# Patient Record
Sex: Female | Born: 1975 | Race: White | Hispanic: No | Smoking: Current every day smoker
Health system: Southern US, Community
[De-identification: ages and names within clinical notes are randomized; demographics above are authoritative.]

## PROBLEM LIST (undated history)

## (undated) DIAGNOSIS — I7300 Raynaud's syndrome without gangrene: Secondary | ICD-10-CM

## (undated) DIAGNOSIS — G43909 Migraine, unspecified, not intractable, without status migrainosus: Secondary | ICD-10-CM

## (undated) DIAGNOSIS — M064 Inflammatory polyarthropathy: Secondary | ICD-10-CM

## (undated) DIAGNOSIS — K219 Gastro-esophageal reflux disease without esophagitis: Secondary | ICD-10-CM

## (undated) DIAGNOSIS — E538 Deficiency of other specified B group vitamins: Secondary | ICD-10-CM

## (undated) DIAGNOSIS — M7918 Myalgia, other site: Secondary | ICD-10-CM

## (undated) DIAGNOSIS — E559 Vitamin D deficiency, unspecified: Secondary | ICD-10-CM

## (undated) DIAGNOSIS — J45909 Unspecified asthma, uncomplicated: Secondary | ICD-10-CM

## (undated) HISTORY — DX: Unspecified asthma, uncomplicated: J45.909

## (undated) HISTORY — DX: Migraine, unspecified, not intractable, without status migrainosus: G43.909

## (undated) HISTORY — PX: TYMPANOPLASTY: SHX33

## (undated) HISTORY — DX: Gastro-esophageal reflux disease without esophagitis: K21.9

---

## 2005-07-31 HISTORY — PX: TUBAL LIGATION: SHX77

## 2011-03-09 ENCOUNTER — Ambulatory Visit: Payer: Self-pay | Admitting: Internal Medicine

## 2014-02-25 DIAGNOSIS — G43909 Migraine, unspecified, not intractable, without status migrainosus: Secondary | ICD-10-CM | POA: Insufficient documentation

## 2014-03-10 ENCOUNTER — Ambulatory Visit: Payer: Self-pay | Admitting: Neurology

## 2015-07-13 ENCOUNTER — Encounter: Payer: Self-pay | Admitting: Family Medicine

## 2015-07-13 ENCOUNTER — Ambulatory Visit (INDEPENDENT_AMBULATORY_CARE_PROVIDER_SITE_OTHER): Payer: Medicaid Other | Admitting: Family Medicine

## 2015-07-13 VITALS — BP 100/62 | HR 64 | Ht 70.0 in | Wt 153.0 lb

## 2015-07-13 DIAGNOSIS — M5116 Intervertebral disc disorders with radiculopathy, lumbar region: Secondary | ICD-10-CM

## 2015-07-13 MED ORDER — CYCLOBENZAPRINE HCL 10 MG PO TABS: 10.0000 mg | ORAL_TABLET | Freq: Three times a day (TID) | ORAL | Status: DC | PRN

## 2015-07-13 MED ORDER — PREDNISONE 10 MG PO TABS: 10.0000 mg | ORAL_TABLET | Freq: Every day | ORAL | Status: DC

## 2015-07-13 MED ORDER — TRAMADOL HCL 50 MG PO TABS: 50.0000 mg | ORAL_TABLET | Freq: Three times a day (TID) | ORAL | Status: DC | PRN

## 2015-07-13 NOTE — Progress Notes (Signed)
Name: Sara Joseph   MRN: [id number]    DOB: May 28, 1976   Date:07/13/2015       Progress Note  Subjective  Chief Complaint  Chief Complaint  Patient presents with  . Back Pain    started yesterday with lower back pain after "wiping the table off"    Back Pain This is a new problem. The current episode started yesterday. The problem occurs constantly. The problem is unchanged. The quality of the pain is described as aching. The pain radiates to the right thigh. The pain is at a severity of 9/10. The pain is moderate. The pain is the same all the time. The symptoms are aggravated by bending and twisting. Pertinent negatives include no abdominal pain, bladder incontinence, bowel incontinence, chest pain, dysuria, fever, headaches, leg pain, numbness, paresis, paresthesias, pelvic pain, perianal numbness, tingling, weakness or weight loss. She has tried NSAIDs for the symptoms. The treatment provided no relief.    No problem-specific assessment & plan notes found for this encounter.   Past Medical History  Diagnosis Date  . Migraine     Past Surgical History  Procedure Laterality Date  . Tubal ligation      No family history on file.  Social History   Social History  . Marital Status: Legally Separated    Spouse Name: N/A  . Number of Children: N/A  . Years of Education: N/A   Occupational History  . Not on file.   Social History Main Topics  . Smoking status: Current Every Day Smoker  . Smokeless tobacco: Not on file  . Alcohol Use: 0.0 oz/week    0 Standard drinks or equivalent per week  . Drug Use: No  . Sexual Activity: Not on file   Other Topics Concern  . Not on file   Social History Narrative  . No narrative on file    No Known Allergies   Review of Systems  Constitutional: Negative for fever, chills, weight loss and malaise/fatigue.  HENT: Negative for ear discharge, ear pain and sore throat.   Eyes: Negative for blurred vision.  Respiratory:  Negative for cough, sputum production, shortness of breath and wheezing.   Cardiovascular: Negative for chest pain, palpitations and leg swelling.  Gastrointestinal: Negative for heartburn, nausea, abdominal pain, diarrhea, constipation, blood in stool, melena and bowel incontinence.  Genitourinary: Negative for bladder incontinence, dysuria, urgency, frequency, hematuria and pelvic pain.  Musculoskeletal: Positive for back pain. Negative for myalgias, joint pain and neck pain.  Skin: Negative for rash.  Neurological: Negative for dizziness, tingling, sensory change, focal weakness, weakness, numbness, headaches and paresthesias.  Endo/Heme/Allergies: Negative for environmental allergies and polydipsia. Does not bruise/bleed easily.  Psychiatric/Behavioral: Negative for depression and suicidal ideas. The patient is not nervous/anxious and does not have insomnia.      Objective  Filed Vitals:   07/13/15 1345  BP: 100/62  Pulse: 64  Height:  (1.778 m)  Weight: 153 lb (69.4 kg)    Physical Exam  Constitutional: She is well-developed, well-nourished, and in no distress. No distress.  HENT:  Head: Normocephalic and atraumatic.  Right Ear: External ear normal.  Left Ear: External ear normal.  Nose: Nose normal.  Mouth/Throat: Oropharynx is clear and moist.  Eyes: Conjunctivae and EOM are normal. Pupils are equal, round, and reactive to light. Right eye exhibits no discharge. Left eye exhibits no discharge.  Neck: Normal range of motion. Neck supple. No JVD present. No thyromegaly present.  Cardiovascular: Normal rate, regular  rhythm, normal heart sounds and intact distal pulses.  Exam reveals no gallop and no friction rub.   No murmur heard. Pulmonary/Chest: Effort normal and breath sounds normal.  Abdominal: Soft. Bowel sounds are normal. She exhibits no mass. There is no tenderness. There is no guarding.  Musculoskeletal: Normal range of motion. She exhibits no edema.        Lumbar back: She exhibits spasm.  Lymphadenopathy:    She has no cervical adenopathy.  Neurological: She is alert. She has normal reflexes. She displays normal reflexes. She exhibits normal muscle tone.  Skin: Skin is warm and dry. She is not diaphoretic.  Psychiatric: Mood and affect normal.  Nursing note and vitals reviewed.     Assessment & Plan  Problem List Items Addressed This Visit    None    Visit Diagnoses    Lumbar disc disease with radiculopathy    -  Primary    suggest aleve or advil    Relevant Medications    cyclobenzaprine (FLEXERIL) 10 MG tablet    traMADol (ULTRAM) 50 MG tablet    predniSONE (DELTASONE) 10 MG tablet         Dr. Hayden Rasmusseneanna Kenetra Hildenbrand [facility]  07/13/2015

## 2015-09-03 ENCOUNTER — Other Ambulatory Visit: Payer: Self-pay

## 2015-09-03 ENCOUNTER — Encounter: Payer: Self-pay | Admitting: Family Medicine

## 2015-09-03 ENCOUNTER — Ambulatory Visit (INDEPENDENT_AMBULATORY_CARE_PROVIDER_SITE_OTHER): Payer: Medicaid Other | Admitting: Family Medicine

## 2015-09-03 ENCOUNTER — Ambulatory Visit (INDEPENDENT_AMBULATORY_CARE_PROVIDER_SITE_OTHER): Payer: Medicaid Other | Admitting: Surgery

## 2015-09-03 ENCOUNTER — Encounter: Payer: Self-pay | Admitting: Surgery

## 2015-09-03 VITALS — BP 114/77 | HR 71 | Temp 98.2°F | Ht 70.0 in | Wt 159.2 lb

## 2015-09-03 VITALS — BP 120/62 | HR 64 | Ht 70.0 in | Wt 159.0 lb

## 2015-09-03 DIAGNOSIS — L02412 Cutaneous abscess of left axilla: Secondary | ICD-10-CM

## 2015-09-03 DIAGNOSIS — L0291 Cutaneous abscess, unspecified: Secondary | ICD-10-CM

## 2015-09-03 NOTE — Patient Instructions (Addendum)
We have opened your abscess this morning, please pack this area daily and place a new dressing on. If you are having difficulty with this, please let me know and I will bring you in next week daily for a nurse visit to repack this area.  Please follow-up with Dr. Orvis Brill as scheduled below to re-evaluate this area next Friday.  If you develop a fever >100.5 or nausea and vomiting at any time, call the office immediately.

## 2015-09-03 NOTE — Progress Notes (Signed)
Name: Sara Joseph   MRN: [id number]    DOB: Dec 28, 1975   Date:09/03/2015       Progress Note  Subjective  Chief Complaint  Chief Complaint  Patient presents with  . Arm Pain    L) axilla pain- ?abscess came up yesterday- now is very painful and red    Arm Pain  The incident occurred 3 to 5 days ago. Pain location: left axillary. The quality of the pain is described as aching. The pain does not radiate. The pain is moderate. The pain has been worsening since the incident. Pertinent negatives include no chest pain or tingling. The symptoms are aggravated by movement and palpation. She has tried heat for the symptoms. The treatment provided no relief.    No problem-specific assessment & plan notes found for this encounter.   Past Medical History  Diagnosis Date  . Migraine     Past Surgical History  Procedure Laterality Date  . Tubal ligation      No family history on file.  Social History   Social History  . Marital Status: Legally Separated    Spouse Name: N/A  . Number of Children: N/A  . Years of Education: N/A   Occupational History  . Not on file.   Social History Main Topics  . Smoking status: Current Every Day Smoker  . Smokeless tobacco: Not on file  . Alcohol Use: 0.0 oz/week    0 Standard drinks or equivalent per week  . Drug Use: No  . Sexual Activity: Not on file   Other Topics Concern  . Not on file   Social History Narrative    Allergies  Allergen Reactions  . Sumatriptan Other (See Comments)    Slurred speech, headache     Review of Systems  Constitutional: Negative for fever, chills, weight loss and malaise/fatigue.  HENT: Negative for ear discharge, ear pain and sore throat.   Eyes: Negative for blurred vision.  Respiratory: Negative for cough, sputum production, shortness of breath and wheezing.   Cardiovascular: Negative for chest pain, palpitations and leg swelling.  Gastrointestinal: Negative for heartburn, nausea, abdominal  pain, diarrhea, constipation, blood in stool and melena.  Genitourinary: Negative for dysuria, urgency, frequency and hematuria.  Musculoskeletal: Negative for myalgias, back pain, joint pain and neck pain.  Skin: Negative for rash.  Neurological: Negative for dizziness, tingling, sensory change, focal weakness and headaches.  Endo/Heme/Allergies: Negative for environmental allergies and polydipsia. Does not bruise/bleed easily.  Psychiatric/Behavioral: Negative for depression and suicidal ideas. The patient is not nervous/anxious and does not have insomnia.      Objective  Filed Vitals:   09/03/15 0845  BP: 120/62  Pulse: 64  Height:  (1.778 m)  Weight: 159 lb (72.122 kg)    Physical Exam  Constitutional: She is well-developed, well-nourished, and in no distress. No distress.  HENT:  Head: Normocephalic and atraumatic.  Right Ear: External ear normal.  Left Ear: External ear normal.  Nose: Nose normal.  Mouth/Throat: Oropharynx is clear and moist.  Eyes: Conjunctivae and EOM are normal. Pupils are equal, round, and reactive to light. Right eye exhibits no discharge. Left eye exhibits no discharge.  Neck: Normal range of motion. Neck supple. No JVD present. No thyromegaly present.  Cardiovascular: Normal rate, regular rhythm, normal heart sounds and intact distal pulses.  Exam reveals no gallop and no friction rub.   No murmur heard. Pulmonary/Chest: Effort normal and breath sounds normal.  Abdominal: Soft. Bowel sounds are normal.  She exhibits no mass. There is no tenderness. There is no guarding.  Musculoskeletal: Normal range of motion. She exhibits no edema.  Lymphadenopathy:    She has no cervical adenopathy.  Neurological: She is alert. She has normal reflexes.  Skin: Skin is warm and dry. She is not diaphoretic. There is erythema.  abcess left axillary  Psychiatric: Mood and affect normal.  Nursing note and vitals reviewed.     Assessment & Plan  Problem  List Items Addressed This Visit    None    Visit Diagnoses    Abscess    -  Primary    Relevant Orders    Ambulatory referral to General Surgery         Dr. Elizabeth Sauer [facility]  09/03/2015

## 2015-09-03 NOTE — Progress Notes (Signed)
Patient ID: Sara Joseph, female   DOB: [birth year], 40 y.o.   MRN: [id number]  History of Present Illness SUESAN MOHRMANN is a 40 y.o. female with left axillary abscess. She reports having significant left axillary pain for the last 2 days, some swelling erythema. In severe sharp worsening with movements. No fevers no chills no other constitutional symptoms. This is the first time she had this happen is all was in good health, no evidence of diabetes or immunological disorder  Past Medical History Past Medical History  Diagnosis Date  . Migraine   . GERD (gastroesophageal reflux disease)        Past Surgical History  Procedure Laterality Date  . Tubal ligation  2007    Allergies  Allergen Reactions  . Sumatriptan Other (See Comments)    Slurred speech, headache    Current Outpatient Prescriptions  Medication Sig Dispense Refill  . ibuprofen (ADVIL,MOTRIN) 200 MG tablet Take 200 mg by mouth every 6 (six) hours as needed. otc    . NON FORMULARY Inject 1 Dose as directed every 30 (thirty) days.    . ranitidine (ZANTAC) 75 MG tablet Take 75 mg by mouth daily as needed for heartburn.     No current facility-administered medications for this visit.    Family History Family History  Problem Relation Age of Onset  . Diabetes Mother   . Hypertension Mother   . Bipolar disorder Father   . Depression Sister   . Depression Brother       Social History Social History  Substance Use Topics  . Smoking status: Current Every Day Smoker -- 0.25 packs/day    Types: Cigarettes, E-cigarettes  . Smokeless tobacco: Never Used  . Alcohol Use: 0.0 oz/week    0 Standard drinks or equivalent per week     Comment: 10 Glasses Whiskey/ Week     ROS R was negative other than as stated in the history of present illness   Physical Exam Blood pressure 114/77, pulse 71, temperature 98.2 F (36.8 C), temperature source Oral, height  (1.778 m), weight 72.213 kg (159 lb 3.2 oz), last  menstrual period 09/01/2015.  CONSTITUTIONAL: No acute distress awake alert EARS, NOSE, MOUTH AND THROAT: The oropharynx is clear. Oral mucosa is pink and moist. Hearing is intact to voice.  NECK: Trachea is midline, and there is no jugular venous distension. Thyroid is without palpable abnormalities. LYMPH NODES:  Lymph nodes in the neck are not enlarged. RESPIRATORY:  Lungs are clear, and breath sounds are equal bilaterally. Normal respiratory effort without pathologic use of accessory muscles. CARDIOVASCULAR: Heart is regular without murmurs, gallops, or rubs. GI: The abdomen is  soft, nontender, and nondistended. There were no palpable masses. There was no hepatosplenomegaly. There were normal bowel sounds. MUSCULOSKELETAL:  Normal muscle strength and tone in all four extremities.    SKIN: Recent abscess on the left axilla measuring 2 x 2 centimeters tender to palpation  NEUROLOGIC:  Motor and sensation is grossly normal.  Cranial nerves are grossly intact. PSYCH:  Alert and oriented to person, place and time. Affect is normal.  Data Reviewed   I have personally reviewed the patient's imaging and medical records.    Assessment/Plan   complex left axillary abscess in need for I&D. Discussed with the patient in detail about the need for I&D. Risks benefits and possible complications she understands and wishes to proceed. Face-to-face time spent with the patient and care providers was 35 minutes, with more than  50% of the time spent counseling, educating, and coordinating care of the patient.     Procedure note Preoperative diagnosis: Left axillary abscess Postoperative diagnosis: Same Procedures: 1. I&D of left axillary abscess complex                        2. Sharp Debridement of skin and subcutaneous tissue measuring 4 cm  Patient was providers benefit appreciated as the left axilla was prepped and draped in the usual sterile fashion and lidocaine 1% with epinephrine was used.  Using an 11 blade knife we drained the abscesses and cut in elliptical skin area. Using the knife were able to debride the skin and subcutaneous Tissue. Electrocautery was used to obtain hemostasis. This was a complex abscess with some loculations that I was able to lyse with hemostat. Quarter-inch packing was placed with a sterile dressing. Instructions were given for daily packing. No need for antibiotics and follow-up next week.  Kristie Bracewell, MD FACS  Mauricia Mertens F Detrick Dani 09/03/2015, 12:46 PM

## 2015-09-06 ENCOUNTER — Ambulatory Visit: Payer: Self-pay

## 2015-09-07 ENCOUNTER — Ambulatory Visit (INDEPENDENT_AMBULATORY_CARE_PROVIDER_SITE_OTHER): Payer: Medicaid Other

## 2015-09-07 DIAGNOSIS — Z4800 Encounter for change or removal of nonsurgical wound dressing: Secondary | ICD-10-CM

## 2015-09-07 NOTE — Patient Instructions (Signed)
We will see you back tomorrow to pack your wound.

## 2015-09-07 NOTE — Progress Notes (Signed)
Patient came in to have her wound rechecked and have it repacked. Patient's wound looks healthier than yesterday. Patient has not had any fever/chills. Patient will return tomorrow for [place] to repack her wound.

## 2015-09-08 ENCOUNTER — Ambulatory Visit (INDEPENDENT_AMBULATORY_CARE_PROVIDER_SITE_OTHER): Payer: Medicaid Other

## 2015-09-08 DIAGNOSIS — Z4802 Encounter for removal of sutures: Secondary | ICD-10-CM

## 2015-09-08 NOTE — Progress Notes (Signed)
Came in today for [place] to change her dressing. Patient's wound is looking much better than yesterday. Patient stated that she was able to sleep better yesterday.  Patient will be back tomorrow for [place] to repack it.

## 2015-09-09 ENCOUNTER — Ambulatory Visit (INDEPENDENT_AMBULATORY_CARE_PROVIDER_SITE_OTHER): Payer: Medicaid Other

## 2015-09-09 DIAGNOSIS — Z5189 Encounter for other specified aftercare: Secondary | ICD-10-CM

## 2015-09-09 NOTE — Progress Notes (Signed)
Patient came in to have her wound checked and repacked. Her wound looked better than yesterday. She also stated that she has not had fever/chills. We will see her tomorrow with Dr. Orvis Brill.

## 2015-09-10 ENCOUNTER — Encounter: Payer: Self-pay | Admitting: Surgery

## 2015-09-10 ENCOUNTER — Ambulatory Visit: Payer: Self-pay | Admitting: Surgery

## 2015-09-10 ENCOUNTER — Ambulatory Visit (INDEPENDENT_AMBULATORY_CARE_PROVIDER_SITE_OTHER): Payer: Medicaid Other | Admitting: Surgery

## 2015-09-10 VITALS — BP 123/75 | HR 73 | Temp 98.5°F | Wt 161.0 lb

## 2015-09-10 DIAGNOSIS — L02419 Cutaneous abscess of limb, unspecified: Secondary | ICD-10-CM

## 2015-09-10 NOTE — Patient Instructions (Signed)
We will see you in two weeks. Remember to leave your packing until Sunday and then just use dry gauze.

## 2015-09-10 NOTE — Progress Notes (Signed)
40 year old female with a left axillary abscess. Patient has been having the wound pack since it was I&D seems to be healing well. Patient states that pain is much improved but there is some itching and irritation from the tape and the area. She denies any fever chills nausea or vomiting.  Filed Vitals:   09/10/15 1105  BP: 123/75  Pulse: 73  Temp: 98.5 F (36.9 C)   PE:  Gen: NAD Left Axilla: rash along area, 1cm area of packing almost to skin edge, clean edges with good granulation material  A/P:  Wound was packed again today she is to remove the packing on Sunday. She can then just placed dry dressings on the area and I will probably finish healing over the next 2 weeks. She is to have a return appointment in 2 weeks to check the progress.

## 2015-09-13 ENCOUNTER — Ambulatory Visit (INDEPENDENT_AMBULATORY_CARE_PROVIDER_SITE_OTHER): Payer: Medicaid Other | Admitting: Surgery

## 2015-09-13 ENCOUNTER — Encounter: Payer: Self-pay | Admitting: Surgery

## 2015-09-13 ENCOUNTER — Telehealth: Payer: Self-pay | Admitting: Surgery

## 2015-09-13 VITALS — BP 123/78 | HR 75 | Temp 97.9°F | Wt 160.0 lb

## 2015-09-13 DIAGNOSIS — R591 Generalized enlarged lymph nodes: Secondary | ICD-10-CM

## 2015-09-13 DIAGNOSIS — L02419 Cutaneous abscess of limb, unspecified: Secondary | ICD-10-CM

## 2015-09-13 MED ORDER — SULFAMETHOXAZOLE-TRIMETHOPRIM 400-80 MG PO TABS: 1.0000 | ORAL_TABLET | Freq: Two times a day (BID) | ORAL | Status: DC

## 2015-09-13 NOTE — Progress Notes (Signed)
Sara Joseph is following up after an I&D of a left axillary abscess.  Overall she is doing okay but over the last few days there has been some increase in pain on the left side. No fevers no chills  She has not had a mammogram and she does have a history of an aunt with breast cancer and grandmother with ovarian cancer   Physical examination:  Breasts bilateral breast without evidence of any masses , nipples are normal skin is normal. Left axilla wound is healing well there is some reactive lymphadenopathy that is mildly tender to palpation. No erythema.  A/P resolving left axillary abscess and now with some reactive lymphadenopathy. I given her family history we will obtain a mammogram I will do a short course of antibiotics and see her back in 2 weeks.

## 2015-09-13 NOTE — Patient Instructions (Addendum)
Your appointment at [facility] ([address] [place], [place], [place] [address]) will be on Thursday 09/16/2015 at 2:20 PM.  We will see you back in 2 weeks to go over mammogram results and to check up on you. Please finish your antibiotics.

## 2015-09-13 NOTE — Telephone Encounter (Signed)
Patient called this morning about two new knots that appeared under her arm over the weekend. She already had a left axillary abscess that was I&D by Dr Everlene Farrier. Patient had been coming into the office last week to have the wound packed since it was I&D. I spoke with the nurse, Amber, and she said to have the patient come in today to see Dr Everlene Farrier.  I will call to make an appointment.

## 2015-09-16 ENCOUNTER — Ambulatory Visit
Admission: RE | Admit: 2015-09-16 | Discharge: 2015-09-16 | Disposition: A | Discharge status: Home / Self Care | Payer: Medicaid Other | Source: Ambulatory Visit | Attending: Surgery | Admitting: Surgery

## 2015-09-16 DIAGNOSIS — R591 Generalized enlarged lymph nodes: Secondary | ICD-10-CM

## 2015-09-16 DIAGNOSIS — Z1231 Encounter for screening mammogram for malignant neoplasm of breast: Secondary | ICD-10-CM | POA: Insufficient documentation

## 2015-09-16 DIAGNOSIS — Z803 Family history of malignant neoplasm of breast: Secondary | ICD-10-CM | POA: Diagnosis not present

## 2015-09-20 ENCOUNTER — Telehealth: Payer: Self-pay | Admitting: General Surgery

## 2015-09-20 MED ORDER — DOXYCYCLINE HYCLATE 100 MG PO CAPS: 100.0000 mg | ORAL_CAPSULE | Freq: Two times a day (BID) | ORAL | Status: DC

## 2015-09-20 NOTE — Telephone Encounter (Signed)
Patient called with a question regarding medication. She wants to know if she should get the 1 refill and take it?

## 2015-09-20 NOTE — Telephone Encounter (Signed)
Returned phone call to patient. She has new places that have come up and the antibiotic is not helping. Pt has completed Bactrim this morning. Spoke with Dr. Orvis Brill about this. She has ordered Doxycycline  BID x 10 days. We will follow-up with patient as scheduled on 2/23.  Patient given all information above. Medication sent in to preferred pharmacy at this time. Encouraged patient to call back with any further questions or concerns.

## 2015-09-23 ENCOUNTER — Ambulatory Visit: Payer: Medicaid Other | Admitting: Surgery

## 2015-09-23 ENCOUNTER — Encounter: Payer: Self-pay | Admitting: Surgery

## 2015-09-23 ENCOUNTER — Ambulatory Visit (INDEPENDENT_AMBULATORY_CARE_PROVIDER_SITE_OTHER): Payer: Medicaid Other | Admitting: Surgery

## 2015-09-23 VITALS — BP 124/80 | HR 87 | Temp 98.6°F | Ht 70.0 in | Wt 162.0 lb

## 2015-09-23 DIAGNOSIS — L732 Hidradenitis suppurativa: Secondary | ICD-10-CM | POA: Insufficient documentation

## 2015-09-23 NOTE — Patient Instructions (Signed)
You have been seen for Hydradenitis in your right under arm. We will get you the number to a dermatologist in the [facility] area that does laser treatments for this.  We will also get you the number to a Surgeon at [facility] that does Migraine surgery.  As soon as I obtain both numbers, I will let you know.

## 2015-09-23 NOTE — Progress Notes (Signed)
40 year old female with left axillary hydradenitis,  Asian states the left underarm is doing better. She denies any drainage fever or redness or chills and states that the tenderness has improved.  Filed Vitals:   09/23/15 1123  BP: 124/80  Pulse: 87  Temp: 98.6 F (37 C)   PE:  Gen: NAD Left axilla: area healed but other areas of hydradenitis with pits visible, none in right axilla or groin  A/P: Improving from the hidradenitis she is to finish out her doxycycline. Discussed with her that the natural course of this disease would be for her to continue to have these pits get inflamed and form abscesses in the future. I will send her to a dermatologist in [facility] that does laser treatments for these areas see if she is amenable to that. Patient also discussed her migraines that are affecting her daily living mainly in her forehead and over her left eye is also having some memory deficits and slow cognition along with these headaches I discussed with her that there is Botox treatments that may be able to help them if these do work that she may be eligible for migraine surgery in the future will refer her to physician  who does the Botox treatments for migraines.

## 2015-10-27 ENCOUNTER — Encounter: Payer: Self-pay | Admitting: Internal Medicine

## 2015-10-27 ENCOUNTER — Ambulatory Visit (INDEPENDENT_AMBULATORY_CARE_PROVIDER_SITE_OTHER): Payer: Medicaid Other | Admitting: Family Medicine

## 2015-10-27 ENCOUNTER — Encounter: Payer: Self-pay | Admitting: Family Medicine

## 2015-10-27 ENCOUNTER — Ambulatory Visit: Payer: Self-pay | Admitting: Family Medicine

## 2015-10-27 ENCOUNTER — Ambulatory Visit: Payer: Medicaid Other | Admitting: Internal Medicine

## 2015-10-27 VITALS — BP 98/70 | HR 78 | Temp 97.5°F | Ht 70.0 in | Wt 160.0 lb

## 2015-10-27 DIAGNOSIS — J4522 Mild intermittent asthma with status asthmaticus: Secondary | ICD-10-CM | POA: Diagnosis not present

## 2015-10-27 DIAGNOSIS — J40 Bronchitis, not specified as acute or chronic: Secondary | ICD-10-CM | POA: Diagnosis not present

## 2015-10-27 DIAGNOSIS — J45909 Unspecified asthma, uncomplicated: Secondary | ICD-10-CM | POA: Insufficient documentation

## 2015-10-27 MED ORDER — PREDNISONE 10 MG PO TABS: 10.0000 mg | ORAL_TABLET | Freq: Every day | ORAL | Status: DC

## 2015-10-27 MED ORDER — ALBUTEROL SULFATE HFA 108 (90 BASE) MCG/ACT IN AERS: 2.0000 | INHALATION_SPRAY | Freq: Four times a day (QID) | RESPIRATORY_TRACT | Status: DC | PRN

## 2015-10-27 MED ORDER — ALBUTEROL SULFATE (2.5 MG/3ML) 0.083% IN NEBU: 2.5000 mg | INHALATION_SOLUTION | Freq: Once | RESPIRATORY_TRACT | Status: AC

## 2015-10-27 MED ORDER — AZITHROMYCIN 250 MG PO TABS: ORAL_TABLET | ORAL | Status: DC

## 2015-10-27 NOTE — Progress Notes (Signed)
Name: Sara Joseph   MRN: [id number]    DOB: 07/01/1976   Date:10/27/2015       Progress Note  Subjective  Chief Complaint  Chief Complaint  Patient presents with  . Asthma    Hx of asthma- been using "old proair inhaler"- not helping    Asthma She complains of chest tightness, cough, difficulty breathing and shortness of breath. There is no hemoptysis, hoarse voice, sputum production or wheezing. This is a recurrent problem. The current episode started in the past 7 days. The problem occurs daily. The problem has been gradually worsening. The cough is non-productive. Associated symptoms include chest pain, dyspnea on exertion and nasal congestion. Pertinent negatives include no ear pain, fever, headaches, heartburn, malaise/fatigue, myalgias, sore throat or weight loss. Her symptoms are aggravated by pollen. Her symptoms are alleviated by nothing. Her past medical history is significant for asthma.    No problem-specific assessment & plan notes found for this encounter.   Past Medical History  Diagnosis Date  . Migraine   . GERD (gastroesophageal reflux disease)   . Asthma     Past Surgical History  Procedure Laterality Date  . Tubal ligation  2007    Family History  Problem Relation Age of Onset  . Diabetes Mother   . Hypertension Mother   . Bipolar disorder Father   . Depression Sister   . Depression Brother   . Breast cancer Maternal Aunt     Social History   Social History  . Marital Status: Legally Separated    Spouse Name: N/A  . Number of Children: N/A  . Years of Education: N/A   Occupational History  . Not on file.   Social History Main Topics  . Smoking status: Current Every Day Smoker -- 0.25 packs/day    Types: Cigarettes, E-cigarettes  . Smokeless tobacco: Never Used  . Alcohol Use: 0.0 oz/week    0 Standard drinks or equivalent per week     Comment: 10 Glasses Whiskey/ Week  . Drug Use: No  . Sexual Activity: Not on file   Other Topics  Concern  . Not on file   Social History Narrative    Allergies  Allergen Reactions  . Sumatriptan Other (See Comments)    Slurred speech, headache     Review of Systems  Constitutional: Negative for fever, chills, weight loss and malaise/fatigue.  HENT: Negative for ear discharge, ear pain, hoarse voice and sore throat.   Eyes: Negative for blurred vision.  Respiratory: Positive for cough and shortness of breath. Negative for hemoptysis, sputum production and wheezing.   Cardiovascular: Positive for chest pain and dyspnea on exertion. Negative for palpitations and leg swelling.  Gastrointestinal: Negative for heartburn, nausea, abdominal pain, diarrhea, constipation, blood in stool and melena.  Genitourinary: Negative for dysuria, urgency, frequency and hematuria.  Musculoskeletal: Negative for myalgias, back pain, joint pain and neck pain.  Skin: Negative for rash.  Neurological: Negative for dizziness, tingling, sensory change, focal weakness and headaches.  Endo/Heme/Allergies: Negative for environmental allergies and polydipsia. Does not bruise/bleed easily.  Psychiatric/Behavioral: Negative for depression and suicidal ideas. The patient is not nervous/anxious and does not have insomnia.      Objective  Filed Vitals:   10/27/15 1115  BP: 98/70  Pulse: 78  Temp: 97.5 F (36.4 C)  TempSrc: Oral  Height: 5\' 10"  (1.778 m)  Weight: 160 lb (72.576 kg)  SpO2: 99%    Physical Exam  Constitutional: She is well-developed, well-nourished, and  in no distress. No distress.  HENT:  Head: Normocephalic and atraumatic.  Right Ear: Tympanic membrane and external ear normal.  Left Ear: Tympanic membrane and external ear normal.  Nose: Nose normal.  Mouth/Throat: Oropharynx is clear and moist.  Eyes: Conjunctivae and EOM are normal. Pupils are equal, round, and reactive to light. Right eye exhibits no discharge. Left eye exhibits no discharge.  Neck: Normal range of motion. Neck  supple. No JVD present. No thyromegaly present.  Cardiovascular: Normal rate, regular rhythm, normal heart sounds and intact distal pulses.  Exam reveals no gallop and no friction rub.   No murmur heard. Pulmonary/Chest: Effort normal. No respiratory distress. She has wheezes. She has no rales.  Abdominal: Soft. Bowel sounds are normal. She exhibits no mass. There is no tenderness. There is no guarding.  Musculoskeletal: Normal range of motion. She exhibits no edema.  Lymphadenopathy:    She has no cervical adenopathy.  Neurological: She is alert. She has normal reflexes.  Skin: Skin is warm and dry. She is not diaphoretic.  Psychiatric: Mood and affect normal.  Nursing note and vitals reviewed.     Assessment & Plan  Problem List Items Addressed This Visit      Respiratory   Asthma - Primary   Relevant Medications   predniSONE (DELTASONE) 10 MG tablet   albuterol (PROVENTIL HFA;VENTOLIN HFA) 108 (90 Base) MCG/ACT inhaler   albuterol (PROVENTIL) (2.5 MG/3ML) 0.083% nebulizer solution 2.5 mg    Other Visit Diagnoses    Bronchitis        Relevant Medications    azithromycin (ZITHROMAX) 250 MG tablet         Dr. [facility]  10/27/2015

## 2015-11-04 ENCOUNTER — Other Ambulatory Visit: Payer: Self-pay

## 2016-01-10 ENCOUNTER — Telehealth: Payer: Self-pay | Admitting: Surgery

## 2016-01-10 MED ORDER — DOXYCYCLINE HYCLATE 100 MG PO TABS: 100.0000 mg | ORAL_TABLET | Freq: Two times a day (BID) | ORAL | Status: DC

## 2016-01-10 NOTE — Telephone Encounter (Signed)
Patient states that she has not seen a Dermatologist that does the Injections of hydradenitis as she has not heard back from our office. I explained that she was supposed to find a dermatologist that is currently doing this treatment. But, I will see if I can get in touch with someone that does this.  Doxycycline sent to preferred pharmacy x 10 days per Dr. Elease EtienneLoflin's plan.

## 2016-01-10 NOTE — Telephone Encounter (Signed)
Patient stated that Dr. Catalina Pizzaold her that she would probably develop hidradenitis again. It has returned under the right arm. She was told they would call her in an antibiotic for it and she wouldn't have to come in. She couldn't remember what it was called.

## 2016-05-02 ENCOUNTER — Other Ambulatory Visit
Admission: RE | Admit: 2016-05-02 | Discharge: 2016-05-02 | Disposition: A | Discharge status: Home / Self Care | Payer: Medicaid Other | Source: Ambulatory Visit | Attending: Family Medicine | Admitting: Family Medicine

## 2016-05-02 ENCOUNTER — Encounter: Payer: Self-pay | Admitting: Family Medicine

## 2016-05-02 ENCOUNTER — Ambulatory Visit (INDEPENDENT_AMBULATORY_CARE_PROVIDER_SITE_OTHER): Payer: Medicaid Other | Admitting: Family Medicine

## 2016-05-02 VITALS — BP 106/74 | HR 72 | Temp 98.2°F | Ht 70.0 in | Wt 163.0 lb

## 2016-05-02 DIAGNOSIS — R1031 Right lower quadrant pain: Secondary | ICD-10-CM | POA: Diagnosis present

## 2016-05-02 LAB — CBC WITH DIFFERENTIAL/PLATELET
BASOS ABS: 0 10*3/uL (ref 0–0.1)
Basophils Relative: 1 %
EOS ABS: 0.3 10*3/uL (ref 0–0.7)
EOS PCT: 4 %
HCT: 38.8 % (ref 35.0–47.0)
HEMOGLOBIN: 13.1 g/dL (ref 12.0–16.0)
LYMPHS PCT: 29 %
Lymphs Abs: 1.8 10*3/uL (ref 1.0–3.6)
MCH: 30.9 pg (ref 26.0–34.0)
MCHC: 33.7 g/dL (ref 32.0–36.0)
MCV: 91.5 fL (ref 80.0–100.0)
Monocytes Absolute: 0.5 10*3/uL (ref 0.2–0.9)
Monocytes Relative: 8 %
NEUTROS PCT: 58 %
Neutro Abs: 3.5 10*3/uL (ref 1.4–6.5)
PLATELETS: 164 10*3/uL (ref 150–440)
RBC: 4.25 MIL/uL (ref 3.80–5.20)
RDW: 13.3 % (ref 11.5–14.5)
WBC: 6.1 10*3/uL (ref 3.6–11.0)

## 2016-05-02 MED ORDER — METRONIDAZOLE 500 MG PO TABS: 500.0000 mg | ORAL_TABLET | Freq: Three times a day (TID) | ORAL | 0 refills | Status: DC

## 2016-05-02 MED ORDER — AMOXICILLIN-POT CLAVULANATE 875-125 MG PO TABS: 1.0000 | ORAL_TABLET | Freq: Two times a day (BID) | ORAL | 0 refills | Status: DC

## 2016-05-02 NOTE — Progress Notes (Signed)
Name: Sara BihariCharity C Joseph   MRN: [id number]    DOB: 04/16/1976   Date:05/02/2016       Progress Note  Subjective  Chief Complaint  Chief Complaint  Patient presents with  . Abdominal Pain    RLQ/ cramping pain that leads to a shooting pain that goes back and forth to the R) side of the back. Is intermittent pain that has been going on for 4 days- Ibuprofen helps for a little while and then pain returns it is sudden and intense    Abdominal Pain  This is a new problem. The current episode started in the past 7 days (4 days). The onset quality is gradual. The problem occurs constantly. The most recent episode lasted 4 days. The problem has been gradually worsening. The pain is located in the RLQ. The pain is at a severity of 7/10. The pain is moderate. The quality of the pain is sharp. The abdominal pain radiates to the right flank and back. Associated symptoms include anorexia and nausea. Pertinent negatives include no constipation, diarrhea, dysuria, fever, frequency, headaches, hematochezia, hematuria, melena, myalgias, vomiting or weight loss. The pain is aggravated by coughing and palpation (sneeze/ running over bumps). The pain is relieved by being still (ibuprofen). Treatments tried: nsaid. The treatment provided mild (for an hour or two) relief. There is no history of abdominal surgery, colon cancer, Crohn's disease, gallstones, GERD, irritable bowel syndrome, pancreatitis, PUD or ulcerative colitis. BTL    No problem-specific Assessment & Plan notes found for this encounter.   Past Medical History:  Diagnosis Date  . Asthma   . GERD (gastroesophageal reflux disease)   . Migraine     Past Surgical History:  Procedure Laterality Date  . TUBAL LIGATION  2007    Family History  Problem Relation Age of Onset  . Diabetes Mother   . Hypertension Mother   . Bipolar disorder Father   . Depression Sister   . Depression Brother   . Breast cancer Maternal Aunt     Social History    Social History  . Marital status: Divorced    Spouse name: N/A  . Number of children: N/A  . Years of education: N/A   Occupational History  . Not on file.   Social History Main Topics  . Smoking status: Current Every Day Smoker    Packs/day: 0.25    Types: Cigarettes, E-cigarettes  . Smokeless tobacco: Never Used  . Alcohol use 0.0 oz/week     Comment: 10 Glasses Whiskey/ Week  . Drug use: No  . Sexual activity: Yes   Other Topics Concern  . Not on file   Social History Narrative  . No narrative on file    Allergies  Allergen Reactions  . Sumatriptan Other (See Comments)    Slurred speech, headache     Review of Systems  Constitutional: Negative for chills, fever, malaise/fatigue and weight loss.  HENT: Negative for ear discharge, ear pain and sore throat.   Eyes: Negative for blurred vision.  Respiratory: Negative for cough, sputum production, shortness of breath and wheezing.   Cardiovascular: Negative for chest pain, palpitations and leg swelling.  Gastrointestinal: Positive for abdominal pain, anorexia and nausea. Negative for blood in stool, constipation, diarrhea, heartburn, hematochezia, melena and vomiting.  Genitourinary: Negative for dysuria, flank pain, frequency, hematuria and urgency.       No vaginal discharge  Musculoskeletal: Negative for back pain, joint pain, myalgias and neck pain.  Skin: Negative for rash.  Neurological: Negative for dizziness, tingling, sensory change, focal weakness and headaches.  Endo/Heme/Allergies: Negative for environmental allergies and polydipsia. Does not bruise/bleed easily.  Psychiatric/Behavioral: Negative for depression and suicidal ideas. The patient is not nervous/anxious and does not have insomnia.      Objective  Vitals:   05/02/16 1016  BP: 106/74  Pulse: 72  Temp: 98.2 F (36.8 C)  TempSrc: Oral  Weight: 163 lb (73.9 kg)  Height: 5\' 10"  (1.778 m)    Physical Exam  Constitutional: She is  well-developed, well-nourished, and in no distress. No distress.  HENT:  Head: Normocephalic and atraumatic.  Right Ear: External ear normal.  Left Ear: External ear normal.  Nose: Nose normal.  Mouth/Throat: Oropharynx is clear and moist.  Eyes: Conjunctivae and EOM are normal. Pupils are equal, round, and reactive to light. Right eye exhibits no discharge. Left eye exhibits no discharge.  Neck: Normal range of motion. Neck supple. No JVD present. No thyromegaly present.  Cardiovascular: Normal rate, regular rhythm, normal heart sounds and intact distal pulses.  Exam reveals no gallop and no friction rub.   No murmur heard. Pulmonary/Chest: Effort normal and breath sounds normal. She has no wheezes. She has no rales.  Abdominal: Soft. Normal aorta and bowel sounds are normal. She exhibits no distension and no mass. There is no hepatosplenomegaly. There is tenderness in the right lower quadrant. There is no rigidity, no rebound, no guarding and no CVA tenderness.  Genitourinary: Rectum normal. Rectal exam shows no external hemorrhoid, no mass and no tenderness.  Musculoskeletal: Normal range of motion. She exhibits no edema.  Lymphadenopathy:    She has no cervical adenopathy.  Neurological: She is alert. She has normal reflexes.  Skin: Skin is warm and dry. She is not diaphoretic.  Psychiatric: Mood and affect normal.  Nursing note and vitals reviewed.     Assessment & Plan  Problem List Items Addressed This Visit    None    Visit Diagnoses    Right lower quadrant abdominal pain    -  Primary    WBC count came back at 6.1- pt told to go to ER if gets worse, otherwise will see her on Thursday for follow up Called in Augmentin 875mg  BID and Flaggyl BID for 10 days   Dr. [facility]  05/02/16

## 2016-05-25 ENCOUNTER — Ambulatory Visit: Payer: Self-pay | Admitting: Family Medicine

## 2016-07-07 ENCOUNTER — Other Ambulatory Visit: Payer: Self-pay | Admitting: Family Medicine

## 2016-07-07 ENCOUNTER — Encounter: Payer: Self-pay | Admitting: Family Medicine

## 2016-07-07 ENCOUNTER — Ambulatory Visit (INDEPENDENT_AMBULATORY_CARE_PROVIDER_SITE_OTHER): Payer: Medicaid Other | Admitting: Family Medicine

## 2016-07-07 ENCOUNTER — Ambulatory Visit
Admission: RE | Admit: 2016-07-07 | Discharge: 2016-07-07 | Disposition: A | Discharge status: Home / Self Care | Payer: Medicaid Other | Source: Ambulatory Visit | Attending: Family Medicine | Admitting: Family Medicine

## 2016-07-07 VITALS — BP 118/78 | HR 86 | Temp 97.7°F | Ht 70.0 in | Wt 161.0 lb

## 2016-07-07 DIAGNOSIS — S6701XS Crushing injury of right thumb, sequela: Secondary | ICD-10-CM

## 2016-07-07 DIAGNOSIS — M7711 Lateral epicondylitis, right elbow: Secondary | ICD-10-CM

## 2016-07-07 DIAGNOSIS — M25531 Pain in right wrist: Secondary | ICD-10-CM | POA: Insufficient documentation

## 2016-07-07 DIAGNOSIS — X58XXXS Exposure to other specified factors, sequela: Secondary | ICD-10-CM | POA: Insufficient documentation

## 2016-07-07 MED ORDER — MELOXICAM 15 MG PO TABS: 15.0000 mg | ORAL_TABLET | Freq: Every day | ORAL | 1 refills | Status: DC

## 2016-07-07 NOTE — Progress Notes (Signed)
Name: Sara Joseph   MRN: [id number]    DOB: 08/10/1975   Date:07/07/2016       Progress Note  Subjective  Chief Complaint  Chief Complaint  Patient presents with  . Arm Pain    Pt stated arm/hand/elbow pain for 1 year    Arm Pain   The incident occurred more than 1 week ago. There was no injury mechanism. The pain is present in the right elbow, right forearm and right hand. The quality of the pain is described as aching. The pain is at a severity of 8/10. The pain is moderate. Pertinent negatives include no chest pain, muscle weakness, numbness or tingling. The symptoms are aggravated by movement. She has tried NSAIDs for the symptoms. The treatment provided moderate relief.  Hand Pain   The incident occurred more than 1 week ago. The incident occurred at home. The injury mechanism was a direct blow (smack it into the wall). The quality of the pain is described as aching. The pain is at a severity of 8/10. The pain is moderate. Pertinent negatives include no chest pain, muscle weakness, numbness or tingling. She has tried NSAIDs and ice for the symptoms. The treatment provided mild relief.    No problem-specific Assessment & Plan notes found for this encounter.   Past Medical History:  Diagnosis Date  . Asthma   . GERD (gastroesophageal reflux disease)   . Migraine     Past Surgical History:  Procedure Laterality Date  . TUBAL LIGATION  2007    Family History  Problem Relation Age of Onset  . Diabetes Mother   . Hypertension Mother   . Bipolar disorder Father   . Depression Sister   . Depression Brother   . Breast cancer Maternal Aunt     Social History   Social History  . Marital status: Divorced    Spouse name: N/A  . Number of children: N/A  . Years of education: N/A   Occupational History  . Not on file.   Social History Main Topics  . Smoking status: Current Every Day Smoker    Packs/day: 0.25    Types: Cigarettes, E-cigarettes  . Smokeless tobacco:  Never Used  . Alcohol use 0.0 oz/week     Comment: 10 Glasses Whiskey/ Week  . Drug use: No  . Sexual activity: Yes   Other Topics Concern  . Not on file   Social History Narrative  . No narrative on file    Allergies  Allergen Reactions  . Sumatriptan Other (See Comments)    Slurred speech, headache     Review of Systems  Constitutional: Negative for chills, fever, malaise/fatigue and weight loss.  HENT: Negative for ear discharge, ear pain and sore throat.   Eyes: Negative for blurred vision.  Respiratory: Negative for cough, sputum production, shortness of breath and wheezing.   Cardiovascular: Negative for chest pain, palpitations and leg swelling.  Gastrointestinal: Negative for abdominal pain, blood in stool, constipation, diarrhea, heartburn, melena and nausea.  Genitourinary: Negative for dysuria, frequency, hematuria and urgency.  Musculoskeletal: Negative for back pain, joint pain, myalgias and neck pain.  Skin: Negative for rash.  Neurological: Negative for dizziness, tingling, sensory change, focal weakness, numbness and headaches.  Endo/Heme/Allergies: Negative for environmental allergies and polydipsia. Does not bruise/bleed easily.  Psychiatric/Behavioral: Negative for depression and suicidal ideas. The patient is not nervous/anxious and does not have insomnia.      Objective  Vitals:   07/07/16 1018  BP: 118/78  Pulse: 86  Temp: 97.7 F (36.5 C)  SpO2: 98%  Weight: 161 lb (73 kg)  Height: 5\' 10"  (1.778 m)    Physical Exam  Constitutional: She is well-developed, well-nourished, and in no distress. No distress.  HENT:  Head: Normocephalic and atraumatic.  Right Ear: External ear normal.  Left Ear: External ear normal.  Nose: Nose normal.  Mouth/Throat: Oropharynx is clear and moist.  Eyes: Conjunctivae and EOM are normal. Pupils are equal, round, and reactive to light. Right eye exhibits no discharge. Left eye exhibits no discharge.  Neck:  Normal range of motion. Neck supple. No JVD present. No thyromegaly present.  Cardiovascular: Normal rate, regular rhythm, normal heart sounds and intact distal pulses.  Exam reveals no gallop and no friction rub.   No murmur heard. Pulmonary/Chest: Effort normal and breath sounds normal. She has no wheezes. She has no rales.  Abdominal: Soft. Bowel sounds are normal. She exhibits no mass. There is no tenderness. There is no guarding.  Musculoskeletal: Normal range of motion. She exhibits no edema.       Right elbow: Tenderness found. Lateral epicondyle tenderness noted.       Right wrist: She exhibits tenderness.       Right hand: She exhibits tenderness and bony tenderness. She exhibits normal range of motion and no swelling. Decreased sensation noted. Normal strength noted.       Hands: Tender scaphoid  Lymphadenopathy:    She has no cervical adenopathy.  Neurological: She is alert. She has normal reflexes.  Skin: Skin is warm and dry. She is not diaphoretic.  Psychiatric: Mood and affect normal.  Nursing note and vitals reviewed.     Assessment & Plan  Problem List Items Addressed This Visit    None    Visit Diagnoses    Lateral epicondylitis of right elbow    -  Primary   Relevant Medications   meloxicam (MOBIC) 15 MG tablet   Other Relevant Orders   DG Elbow Complete Right (Completed)   Wrist pain, right       Relevant Medications   meloxicam (MOBIC) 15 MG tablet   Other Relevant Orders   DG Wrist Complete Right (Completed)   Crushing injury of right thumb, sequela       proximal 1st metacarpal   Relevant Medications   meloxicam (MOBIC) 15 MG tablet   Other Relevant Orders   DG Hand Complete Right (Completed)        Dr. Hayden Rasmusseneanna Riad Wagley [facility]  07/07/16

## 2016-07-31 HISTORY — PX: HAND TENDON SURGERY: SHX663

## 2016-11-07 ENCOUNTER — Ambulatory Visit (INDEPENDENT_AMBULATORY_CARE_PROVIDER_SITE_OTHER): Payer: Medicaid Other | Admitting: Family Medicine

## 2016-11-07 ENCOUNTER — Encounter: Payer: Self-pay | Admitting: Family Medicine

## 2016-11-07 VITALS — BP 120/70 | HR 80 | Ht 70.0 in | Wt 156.0 lb

## 2016-11-07 DIAGNOSIS — S46211A Strain of muscle, fascia and tendon of other parts of biceps, right arm, initial encounter: Secondary | ICD-10-CM | POA: Diagnosis not present

## 2016-11-07 DIAGNOSIS — M7711 Lateral epicondylitis, right elbow: Secondary | ICD-10-CM | POA: Diagnosis not present

## 2016-11-07 NOTE — Progress Notes (Signed)
Name: Sara Joseph   MRN: [id number]    DOB: [birth year]   Date:11/07/2016       Progress Note  Subjective  Chief Complaint  Chief Complaint  Patient presents with  . Elbow Pain    R) elbow pain- did an xray in Dec of elbow, wrist and hand- came back normal, no fx, no dislocation, no soft tissue injury.- Next step ortho    Arm Pain   There was no injury mechanism. The pain is present in the right elbow and upper right arm. The quality of the pain is described as aching. The pain is at a severity of 7/10. The pain is moderate. The pain has been fluctuating since the incident. Pertinent negatives include no chest pain, muscle weakness, numbness or tingling. The symptoms are aggravated by movement. She has tried NSAIDs for the symptoms. The treatment provided moderate relief.    No problem-specific Assessment & Plan notes found for this encounter.   Past Medical History:  Diagnosis Date  . Asthma   . GERD (gastroesophageal reflux disease)   . Migraine     Past Surgical History:  Procedure Laterality Date  . TUBAL LIGATION  2007    Family History  Problem Relation Age of Onset  . Diabetes Mother   . Hypertension Mother   . Bipolar disorder Father   . Depression Sister   . Depression Brother   . Breast cancer Maternal Aunt     Social History   Social History  . Marital status: Divorced    Spouse name: N/A  . Number of children: N/A  . Years of education: N/A   Occupational History  . Not on file.   Social History Main Topics  . Smoking status: Current Every Day Smoker    Packs/day: 0.25    Types: Cigarettes, E-cigarettes  . Smokeless tobacco: Never Used  . Alcohol use 0.0 oz/week     Comment: 10 Glasses Whiskey/ Week  . Drug use: No  . Sexual activity: Yes   Other Topics Concern  . Not on file   Social History Narrative  . No narrative on file    Allergies  Allergen Reactions  . Sumatriptan Other (See Comments)    Slurred speech, headache     Outpatient Medications Prior to Visit  Medication Sig Dispense Refill  . albuterol (PROVENTIL HFA;VENTOLIN HFA) 108 (90 Base) MCG/ACT inhaler Inhale 2 puffs into the lungs every 6 (six) hours as needed for wheezing or shortness of breath. 1 Inhaler 11  . ibuprofen (ADVIL,MOTRIN) 200 MG tablet Take 200 mg by mouth every 6 (six) hours as needed. otc    . ranitidine (ZANTAC) 75 MG tablet Take 75 mg by mouth daily as needed for heartburn.    . traMADol (ULTRAM) 50 MG tablet Take by mouth every 12 (twelve) hours as needed. For migraines PRN    . amoxicillin-clavulanate (AUGMENTIN) 875-125 MG tablet Take 1 tablet by mouth 2 (two) times daily. 20 tablet 0  . MAGNESIUM PO Take by mouth.    . meloxicam (MOBIC) 15 MG tablet TAKE 1 TABLET(15 MG) BY MOUTH DAILY 90 tablet 1  . metroNIDAZOLE (FLAGYL) 500 MG tablet Take 1 tablet (500 mg total) by mouth 3 (three) times daily. (Patient not taking: Reported on 07/07/2016) 30 tablet 0   Facility-Administered Medications Prior to Visit  Medication Dose Route Frequency Provider Last Rate Last Dose  . albuterol (PROVENTIL) (2.5 MG/3ML) 0.083% nebulizer solution 2.5 mg  2.5 mg Nebulization Once Gerrianne Aydelott C  Yetta Barre, MD        Review of Systems  Constitutional: Negative for chills, fever, malaise/fatigue and weight loss.  HENT: Negative for ear discharge, ear pain and sore throat.   Eyes: Negative for blurred vision.  Respiratory: Negative for cough, sputum production, shortness of breath and wheezing.   Cardiovascular: Negative for chest pain, palpitations and leg swelling.  Gastrointestinal: Negative for abdominal pain, blood in stool, constipation, diarrhea, heartburn, melena and nausea.  Genitourinary: Negative for dysuria, frequency, hematuria and urgency.  Musculoskeletal: Positive for joint pain and myalgias. Negative for back pain and neck pain.  Skin: Negative for rash.  Neurological: Negative for dizziness, tingling, sensory change, focal weakness,  numbness and headaches.  Endo/Heme/Allergies: Negative for environmental allergies and polydipsia. Does not bruise/bleed easily.  Psychiatric/Behavioral: Negative for depression and suicidal ideas. The patient is not nervous/anxious and does not have insomnia.      Objective  Vitals:   11/07/16 1036  BP: 120/70  Pulse: 80  Weight: 156 lb (70.8 kg)  Height:  (1.778 m)    Physical Exam  Constitutional: She is well-developed, well-nourished, and in no distress. No distress.  HENT:  Head: Normocephalic and atraumatic.  Right Ear: External ear normal.  Left Ear: External ear normal.  Nose: Nose normal.  Mouth/Throat: Oropharynx is clear and moist.  Eyes: Conjunctivae and EOM are normal. Pupils are equal, round, and reactive to light. Right eye exhibits no discharge. Left eye exhibits no discharge.  Neck: Normal range of motion. Neck supple. No JVD present. No thyromegaly present.  Cardiovascular: Normal rate, regular rhythm, normal heart sounds and intact distal pulses.  Exam reveals no gallop and no friction rub.   No murmur heard. Pulmonary/Chest: Effort normal and breath sounds normal.  Abdominal: Soft. Bowel sounds are normal. She exhibits no mass. There is no tenderness. There is no guarding.  Musculoskeletal: Normal range of motion. She exhibits no edema.       Right elbow: Tenderness found. Radial head and lateral epicondyle tenderness noted.       Right upper arm: She exhibits tenderness.       Right forearm: She exhibits tenderness.  Lymphadenopathy:    She has no cervical adenopathy.  Neurological: She is alert. She has normal strength and normal reflexes. A sensory deficit is present.  Skin: Skin is warm and dry. She is not diaphoretic.  Psychiatric: Mood and affect normal.      Assessment & Plan  Problem List Items Addressed This Visit    None    Visit Diagnoses    Lateral epicondylitis of right elbow    -  Primary   Relevant Orders   Ambulatory  referral to Orthopedic Surgery   Strain of right biceps, initial encounter       Relevant Orders   Ambulatory referral to Orthopedic Surgery      No orders of the defined types were placed in this encounter.     Dr. [facility]  11/07/16

## 2017-01-23 ENCOUNTER — Ambulatory Visit (INDEPENDENT_AMBULATORY_CARE_PROVIDER_SITE_OTHER): Payer: Medicaid Other | Admitting: Family Medicine

## 2017-01-23 ENCOUNTER — Encounter: Payer: Self-pay | Admitting: Family Medicine

## 2017-01-23 VITALS — BP 122/88 | HR 90 | Temp 98.6°F | Ht 70.0 in | Wt 153.0 lb

## 2017-01-23 DIAGNOSIS — S66802A Unspecified injury of other specified muscles, fascia and tendons at wrist and hand level, left hand, initial encounter: Secondary | ICD-10-CM

## 2017-01-23 NOTE — Progress Notes (Signed)
Name: Sara Joseph   MRN: [id number]    DOB: 1975/09/08   Date:01/23/2017       Progress Note  Subjective  Chief Complaint  Chief Complaint  Patient presents with  . Hand Pain    Lt foot thumb is swollen/painful for about 4 days    Hand Pain   The incident occurred 3 to 5 days ago. The incident occurred at home. The injury mechanism was twisted. The pain is present in the left hand (thumb). The quality of the pain is described as aching. Radiates to: left wrist. The pain is at a severity of 2/10. The pain is moderate. The pain has been fluctuating since the incident. Associated symptoms include muscle weakness. Pertinent negatives include no chest pain, numbness or tingling. The symptoms are aggravated by movement. The treatment provided mild relief.    No problem-specific Assessment & Plan notes found for this encounter.   Past Medical History:  Diagnosis Date  . Asthma   . GERD (gastroesophageal reflux disease)   . Migraine     Past Surgical History:  Procedure Laterality Date  . TUBAL LIGATION  2007    Family History  Problem Relation Age of Onset  . Diabetes Mother   . Hypertension Mother   . Bipolar disorder Father   . Depression Sister   . Depression Brother   . Breast cancer Maternal Aunt     Social History   Social History  . Marital status: Divorced    Spouse name: N/A  . Number of children: N/A  . Years of education: N/A   Occupational History  . Not on file.   Social History Main Topics  . Smoking status: Current Every Day Smoker    Packs/day: 0.25    Types: Cigarettes, E-cigarettes  . Smokeless tobacco: Never Used  . Alcohol use 0.0 oz/week     Comment: 10 Glasses Whiskey/ Week  . Drug use: No  . Sexual activity: Yes   Other Topics Concern  . Not on file   Social History Narrative  . No narrative on file    Allergies  Allergen Reactions  . Sumatriptan Other (See Comments)    Slurred speech, headache    Outpatient Medications  Prior to Visit  Medication Sig Dispense Refill  . albuterol (PROVENTIL HFA;VENTOLIN HFA) 108 (90 Base) MCG/ACT inhaler Inhale 2 puffs into the lungs every 6 (six) hours as needed for wheezing or shortness of breath. 1 Inhaler 11  . ibuprofen (ADVIL,MOTRIN) 200 MG tablet Take 200 mg by mouth every 6 (six) hours as needed. otc    . ranitidine (ZANTAC) 75 MG tablet Take 75 mg by mouth daily as needed for heartburn.    . traMADol (ULTRAM) 50 MG tablet Take by mouth every 12 (twelve) hours as needed. For migraines PRN     Facility-Administered Medications Prior to Visit  Medication Dose Route Frequency Provider Last Rate Last Dose  . albuterol (PROVENTIL) (2.5 MG/3ML) 0.083% nebulizer solution 2.5 mg  2.5 mg Nebulization Once Duanne Limerick, MD        Review of Systems  Constitutional: Negative for chills, fever, malaise/fatigue and weight loss.  HENT: Negative for ear discharge, ear pain and sore throat.   Eyes: Negative for blurred vision.  Respiratory: Negative for cough, sputum production, shortness of breath and wheezing.   Cardiovascular: Negative for chest pain, palpitations and leg swelling.  Gastrointestinal: Negative for abdominal pain, blood in stool, constipation, diarrhea, heartburn, melena and nausea.  Genitourinary: Negative  for dysuria, frequency, hematuria and urgency.  Musculoskeletal: Negative for back pain, myalgias and neck pain.  Skin: Negative for rash.  Neurological: Negative for dizziness, tingling, sensory change, focal weakness, numbness and headaches.  Endo/Heme/Allergies: Negative for environmental allergies and polydipsia. Does not bruise/bleed easily.  Psychiatric/Behavioral: Negative for depression and suicidal ideas. The patient is not nervous/anxious and does not have insomnia.      Objective  Vitals:   01/23/17 1606  BP: 122/88  Pulse: 90  Temp: 98.6 F (37 C)  SpO2: 99%  Weight: 153 lb (69.4 kg)  Height: 5\' 10"  (1.778 m)    Physical Exam    Constitutional: She is well-developed, well-nourished, and in no distress. No distress.  HENT:  Head: Normocephalic and atraumatic.  Right Ear: External ear normal.  Left Ear: External ear normal.  Nose: Nose normal.  Mouth/Throat: Oropharynx is clear and moist.  Eyes: Conjunctivae and EOM are normal. Pupils are equal, round, and reactive to light. Right eye exhibits no discharge. Left eye exhibits no discharge.  Neck: Normal range of motion. Neck supple. No JVD present. No thyromegaly present.  Cardiovascular: Normal rate, regular rhythm, normal heart sounds and intact distal pulses.  Exam reveals no gallop and no friction rub.   No murmur heard. Pulmonary/Chest: Effort normal and breath sounds normal. She has no wheezes. She has no rales.  Abdominal: Soft. Bowel sounds are normal. She exhibits no mass. There is no tenderness. There is no guarding.  Musculoskeletal: She exhibits no edema.       Left hand: She exhibits decreased range of motion, tenderness, bony tenderness and swelling. She exhibits normal capillary refill. Normal sensation noted. Decreased strength noted.       Hands: Ecchymosis thenar eminence  Lymphadenopathy:    She has no cervical adenopathy.  Neurological: She is alert. She has normal reflexes.  Skin: Skin is warm and dry. She is not diaphoretic.  Psychiatric: Mood and affect normal.  Nursing note and vitals reviewed.     Assessment & Plan  Problem List Items Addressed This Visit    None    Visit Diagnoses    Injury of ulnar collateral ligament of left wrist, initial encounter    -  Primary   has tramadol at home   Relevant Orders   DG Finger Thumb Left      No orders of the defined types were placed in this encounter.     Dr. Hayden Rasmusseneanna Alvena Kiernan [facility]  01/23/17

## 2017-01-23 NOTE — Patient Instructions (Signed)
Ulnar Collateral Ligament Injury of the Thumb A ligament is a strong band of tissue that connects and supports bones. Ulnar collateral ligament (UCL) injury happens when the UCL at the base of the thumb is stretched or torn. A tear can be either partial or complete. The severity of the injury depends on how much of the ligament was damaged or torn. The UCL ligament is important for normal use of the thumb. This ligament helps you to use and move your thumb. UCL injury can happen suddenly (acuteinjury) or gradually (chronic injury) with repeated overstretching of the ligament. If it is not treated properly, UCL injury can lead to arthritis. What are the causes? This injury is caused by forcefully moving the thumb past its normal range of motion toward the wrist. If you extend your hands to catch an object or to protect yourself while falling, the force of the impact can cause your ligament to stretch too much. This excess tension can also cause your ligament to tear. What increases the risk? This injury is more likely to occur in:  People who have had a previous thumb injury or sprain.  People who play contact sports or sports that involve catching balls, such as baseball, basketball, or football.  People who do activities that increase the chance that the thumb will be pulled away from the rest of the hand.  People who have poor hand strength and flexibility.  People who do not warm up properly before activities.  What are the signs or symptoms? Symptoms of this injury include:  Pain or tenderness over the injured area with movement of the thumb.  Pain when the injured area is pressed.  Bruising or redness at the base of the thumb. This can spread to the whole thumb and part of the hand.  Swelling over the injured area.  Difficulty grasping or pinching with the injured thumb due to weakness or pain.  If the injury is severe, a lump (mass) may be felt under the skin in the injured  area. How is this diagnosed? This injury is diagnosed with a medical history and physical exam. You may also have imaging studies, including:  X-ray.  Ultrasound.  MRI.  How is this treated? Treatment varies depending on the severity of your injury. If the UCL is overstretched or partially torn, treatment usually involves keeping your thumb in a fixed position (immobilization) for a period of time. To help you do this, your health care provider will apply a brace, cast, or splint to keep your thumb from moving until it heals. If the UCL is fully torn, you may need surgery to reconnect the ligament to the bone. After surgery, a cast or splint will be applied and it will need to stay on your thumb while it heals. Your health care provider may also suggest exercises or physical therapy to strengthen your thumb. Follow these instructions at home: If you have a cast:  Do not stick anything inside the cast to scratch your skin. Doing that increases your risk of infection.  Check the skin around the cast every day. Report any concerns to your health care provider. You may put lotion on dry skin around the edges of the cast. Do not apply lotion to the skin underneath the cast.  Keep the cast clean and dry. If you have a splint or brace:  Wear it as told by your health care provider. Remove it only as told by your health care provider.  Loosen it if  your fingers become numb and tingle, or if they turn cold and blue.  Keep the brace or splint clean and dry. Bathing  Cover the cast or splint and bandage (dressing) with a watertight plastic bag to protect it from water while you take a bath or shower. Do not let the cast or splint and dressing get wet. Managing pain, stiffness, and swelling  If directed, apply ice to the injured area: ? Put ice in a plastic bag. ? Place a towel between your skin and the bag. ? Leave the ice on for 20 minutes, 2-3 times per day.  Move your fingers often to  avoid stiffness and to lessen swelling.  Raise (elevate) the injured area above the level of your heart while you are sitting or lying down. Driving  Do not drive or operate heavy machinery while taking prescription pain medicine.  Ask your health care provider when it is safe to drive if you have a cast, splint, or brace on your hand. General instructions  Do not put pressure on any part of your cast or splint until it is fully hardened. This may take several hours.  Take over-the-counter and prescription medicines only as told by your health care provider.  Keep all follow-up visits as told by your health care provider. This is important.  Do not wear rings on your injured thumb.  Do any exercise or physical therapy as told by your health care provider. Contact a health care provider if:  Your pain is not controlled with medicine.  Your bruising or swelling gets worse.  Your cast or splint is damaged.  Your thumb is numb or blue.  Your thumb feels colder than normal. This information is not intended to replace advice given to you by your health care provider. Make sure you discuss any questions you have with your health care provider. Document Released: 07/17/2005 Document Revised: 03/19/2016 Document Reviewed: 09/23/2014 Elsevier Interactive Patient Education  [employer].

## 2017-02-21 ENCOUNTER — Other Ambulatory Visit: Payer: Self-pay | Admitting: Orthopaedic Surgery

## 2017-02-21 DIAGNOSIS — S63642A Sprain of metacarpophalangeal joint of left thumb, initial encounter: Secondary | ICD-10-CM

## 2017-02-27 ENCOUNTER — Ambulatory Visit
Admission: RE | Admit: 2017-02-27 | Discharge: 2017-02-27 | Disposition: A | Discharge status: Home / Self Care | Payer: Medicaid Other | Source: Ambulatory Visit | Attending: Orthopaedic Surgery | Admitting: Orthopaedic Surgery

## 2017-02-27 DIAGNOSIS — S63642A Sprain of metacarpophalangeal joint of left thumb, initial encounter: Secondary | ICD-10-CM | POA: Diagnosis not present

## 2017-02-27 DIAGNOSIS — R600 Localized edema: Secondary | ICD-10-CM | POA: Diagnosis not present

## 2017-02-27 DIAGNOSIS — X58XXXA Exposure to other specified factors, initial encounter: Secondary | ICD-10-CM | POA: Diagnosis not present

## 2017-04-06 ENCOUNTER — Encounter: Payer: Self-pay | Admitting: Family Medicine

## 2017-04-06 ENCOUNTER — Ambulatory Visit (INDEPENDENT_AMBULATORY_CARE_PROVIDER_SITE_OTHER): Payer: Medicaid Other | Admitting: Family Medicine

## 2017-04-06 VITALS — BP 118/60 | HR 72 | Ht 70.0 in | Wt 153.0 lb

## 2017-04-06 DIAGNOSIS — R102 Pelvic and perineal pain: Secondary | ICD-10-CM | POA: Diagnosis not present

## 2017-04-06 DIAGNOSIS — R1032 Left lower quadrant pain: Secondary | ICD-10-CM | POA: Diagnosis not present

## 2017-04-06 NOTE — Progress Notes (Signed)
Name: Sara Joseph   MRN: [id number]    DOB: Nov 07, 1975   Date:04/06/2017       Progress Note  Subjective  Chief Complaint  Chief Complaint  Patient presents with  . Abdominal Pain    LLQ pain described as a sharp, stabbing pain x "couple of days". Pain during sex  "for a couple of months". Has been treated for diverticulitis in the past- has been eating "a lot of rice"    Abdominal Pain  This is a new problem. The current episode started in the past 7 days (2-3 days ago). The onset quality is gradual. The problem occurs constantly. The problem has been gradually worsening. The pain is located in the LLQ. The pain is at a severity of 7/10. The pain is moderate. The quality of the pain is sharp. The abdominal pain radiates to the back. Associated symptoms include diarrhea and nausea. Pertinent negatives include no arthralgias, belching, constipation, dysuria, fever, frequency, headaches, hematochezia, hematuria, melena, myalgias, vomiting or weight loss. The pain is aggravated by certain positions, movement and bowel movement (sitting/walking uncomfortable). ? family hx colitis/gastroparesis  Pelvic Pain  The patient's primary symptoms include pelvic pain and vaginal discharge. The patient's pertinent negatives include no genital itching, genital lesions, genital odor, genital rash, missed menses or vaginal bleeding. Primary symptoms comment: whitish discharge. This is a new problem. The current episode started in the past 7 days. The problem occurs constantly. The problem has been waxing and waning. The pain is moderate. The problem affects the left side. She is not pregnant (BTL). Associated symptoms include abdominal pain, diarrhea and nausea. Pertinent negatives include no back pain, chills, constipation, dysuria, fever, frequency, headaches, hematuria, joint pain, rash, sore throat, urgency or vomiting. The vaginal discharge was white. There has been no bleeding. (? family hx  colitis/gastroparesis)    No problem-specific Assessment & Plan notes found for this encounter.   Past Medical History:  Diagnosis Date  . Asthma   . GERD (gastroesophageal reflux disease)   . Migraine     Past Surgical History:  Procedure Laterality Date  . TUBAL LIGATION  2007    Family History  Problem Relation Age of Onset  . Diabetes Mother   . Hypertension Mother   . Bipolar disorder Father   . Depression Sister   . Depression Brother   . Breast cancer Maternal Aunt     Social History   Social History  . Marital status: Divorced    Spouse name: N/A  . Number of children: N/A  . Years of education: N/A   Occupational History  . Not on file.   Social History Main Topics  . Smoking status: Current Every Day Smoker    Packs/day: 0.25    Types: Cigarettes, E-cigarettes  . Smokeless tobacco: Never Used  . Alcohol use 0.0 oz/week     Comment: 10 Glasses Whiskey/ Week  . Drug use: No  . Sexual activity: Yes   Other Topics Concern  . Not on file   Social History Narrative  . No narrative on file    Allergies  Allergen Reactions  . Sumatriptan Other (See Comments)    Slurred speech, headache    Outpatient Medications Prior to Visit  Medication Sig Dispense Refill  . albuterol (PROVENTIL HFA;VENTOLIN HFA) 108 (90 Base) MCG/ACT inhaler Inhale 2 puffs into the lungs every 6 (six) hours as needed for wheezing or shortness of breath. 1 Inhaler 11  . ibuprofen (ADVIL,MOTRIN) 200 MG tablet Take  200 mg by mouth every 6 (six) hours as needed. otc    . ranitidine (ZANTAC) 75 MG tablet Take 75 mg by mouth daily as needed for heartburn.    . traMADol (ULTRAM) 50 MG tablet Take by mouth every 12 (twelve) hours as needed. For migraines PRN     Facility-Administered Medications Prior to Visit  Medication Dose Route Frequency Provider Last Rate Last Dose  . albuterol (PROVENTIL) (2.5 MG/3ML) 0.083% nebulizer solution 2.5 mg  2.5 mg Nebulization Once Duanne Limerick,  MD        Review of Systems  Constitutional: Negative for chills, fever, malaise/fatigue and weight loss.  HENT: Negative for ear discharge, ear pain and sore throat.   Eyes: Negative for blurred vision.  Respiratory: Negative for cough, sputum production, shortness of breath and wheezing.   Cardiovascular: Negative for chest pain, palpitations and leg swelling.  Gastrointestinal: Positive for abdominal pain, diarrhea and nausea. Negative for blood in stool, constipation, heartburn, hematochezia, melena and vomiting.  Genitourinary: Positive for pelvic pain and vaginal discharge. Negative for dysuria, frequency, hematuria, missed menses and urgency.  Musculoskeletal: Negative for arthralgias, back pain, joint pain, myalgias and neck pain.  Skin: Negative for rash.  Neurological: Negative for dizziness, tingling, sensory change, focal weakness and headaches.  Endo/Heme/Allergies: Negative for environmental allergies and polydipsia. Does not bruise/bleed easily.  Psychiatric/Behavioral: Negative for depression and suicidal ideas. The patient is not nervous/anxious and does not have insomnia.      Objective  Vitals:   04/06/17 1055  BP: 118/60  Pulse: 72  Weight: 153 lb (69.4 kg)  Height:  (1.778 m)    Physical Exam  Constitutional: She is well-developed, well-nourished, and in no distress. No distress.  HENT:  Head: Normocephalic and atraumatic.  Right Ear: External ear normal.  Left Ear: External ear normal.  Nose: Nose normal.  Mouth/Throat: Oropharynx is clear and moist.  Eyes: Pupils are equal, round, and reactive to light. Conjunctivae and EOM are normal. Right eye exhibits no discharge. Left eye exhibits no discharge.  Neck: Normal range of motion. Neck supple. No JVD present. No thyromegaly present.  Cardiovascular: Normal rate, regular rhythm, normal heart sounds and intact distal pulses.  Exam reveals no gallop and no friction rub.   No murmur  heard. Pulmonary/Chest: Effort normal and breath sounds normal. She has no wheezes. She has no rales.  Abdominal: Soft. Bowel sounds are normal. She exhibits no mass. There is no hepatosplenomegaly. There is tenderness in the left lower quadrant. There is no rigidity, no rebound, no guarding and no CVA tenderness.  Genitourinary: Rectum normal. Rectal exam shows no external hemorrhoid, no tenderness and guaiac negative stool.  Musculoskeletal: Normal range of motion. She exhibits no edema.  Lymphadenopathy:    She has no cervical adenopathy.  Neurological: She is alert. She has normal reflexes.  Skin: Skin is warm and dry. She is not diaphoretic.  Psychiatric: Mood and affect normal.  Nursing note and vitals reviewed.     Assessment & Plan  Problem List Items Addressed This Visit    None    Visit Diagnoses    Left lower quadrant pain    -  Primary   simular episode right side 11 months ago/referral to er   Pelvic pain         Send to ER for further evaluations No orders of the defined types were placed in this encounter.     Dr. Elizabeth Sauer [facility]  04/06/17

## 2017-08-27 ENCOUNTER — Encounter: Payer: Self-pay | Admitting: Family Medicine

## 2017-08-27 ENCOUNTER — Ambulatory Visit: Payer: Self-pay | Admitting: Family Medicine

## 2017-08-27 ENCOUNTER — Ambulatory Visit (INDEPENDENT_AMBULATORY_CARE_PROVIDER_SITE_OTHER): Payer: Medicaid Other | Admitting: Family Medicine

## 2017-08-27 VITALS — BP 102/60 | HR 72 | Ht 70.0 in | Wt 151.0 lb

## 2017-08-27 DIAGNOSIS — G4730 Sleep apnea, unspecified: Secondary | ICD-10-CM

## 2017-08-27 DIAGNOSIS — R0602 Shortness of breath: Secondary | ICD-10-CM

## 2017-08-27 DIAGNOSIS — R6889 Other general symptoms and signs: Secondary | ICD-10-CM | POA: Diagnosis not present

## 2017-08-27 MED ORDER — MONTELUKAST SODIUM 10 MG PO TABS: 10.0000 mg | ORAL_TABLET | Freq: Every day | ORAL | 3 refills | Status: DC

## 2017-08-27 NOTE — Patient Instructions (Signed)
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
## 2017-09-24 ENCOUNTER — Ambulatory Visit: Payer: Medicaid Other | Admitting: Family Medicine

## 2017-09-24 ENCOUNTER — Encounter: Payer: Self-pay | Admitting: Family Medicine

## 2017-09-24 VITALS — BP 98/68 | HR 68 | Resp 16 | Ht 70.0 in | Wt 148.0 lb

## 2017-09-24 DIAGNOSIS — F17210 Nicotine dependence, cigarettes, uncomplicated: Secondary | ICD-10-CM

## 2017-09-24 DIAGNOSIS — F419 Anxiety disorder, unspecified: Secondary | ICD-10-CM

## 2017-09-24 DIAGNOSIS — R0600 Dyspnea, unspecified: Secondary | ICD-10-CM

## 2017-09-24 DIAGNOSIS — J449 Chronic obstructive pulmonary disease, unspecified: Secondary | ICD-10-CM | POA: Diagnosis not present

## 2017-09-24 DIAGNOSIS — J4520 Mild intermittent asthma, uncomplicated: Secondary | ICD-10-CM | POA: Diagnosis not present

## 2017-09-24 NOTE — Progress Notes (Signed)
Name: Sara Joseph   MRN: [id number]    DOB: 07-31-1976   Date:09/24/2017       Progress Note  Subjective  Chief Complaint  Chief Complaint  Patient presents with  . Shortness of Breath    Shortness of Breath  This is a new problem. The current episode started more than 1 month ago (couple of month). The problem occurs daily. The problem has been waxing and waning. Associated symptoms include abdominal pain and headaches. Pertinent negatives include no chest pain, claudication, coryza, ear pain, fever, hemoptysis, leg pain, leg swelling, neck pain, orthopnea, PND, rash, rhinorrhea, sore throat, sputum production, swollen glands, syncope, vomiting or wheezing. Nothing aggravates the symptoms. Associated symptoms comments: epigastric. Risk factors include oral contraceptive. She has tried beta agonist inhalers and steroid inhalers for the symptoms. The treatment provided mild relief. There is no history of allergies, aspirin allergies, asthma, bronchiolitis, CAD, chronic lung disease, COPD, DVT, a heart failure, PE, pneumonia or a recent surgery. (Advair)    No problem-specific Assessment & Plan notes found for this encounter.   Past Medical History:  Diagnosis Date  . Asthma   . GERD (gastroesophageal reflux disease)   . Migraine     Past Surgical History:  Procedure Laterality Date  . TUBAL LIGATION  2007    Family History  Problem Relation Age of Onset  . Diabetes Mother   . Hypertension Mother   . Bipolar disorder Father   . Depression Sister   . Depression Brother   . Breast cancer Maternal Aunt     Social History   Socioeconomic History  . Marital status: Divorced    Spouse name: Not on file  . Number of children: Not on file  . Years of education: Not on file  . Highest education level: Not on file  Social Needs  . Financial resource strain: Not on file  . Food insecurity - worry: Not on file  . Food insecurity - inability: Not on file  . Transportation needs  - medical: Not on file  . Transportation needs - non-medical: Not on file  Occupational History  . Not on file  Tobacco Use  . Smoking status: Light Tobacco Smoker    Packs/day: 0.25    Types: Cigarettes, E-cigarettes  . Smokeless tobacco: Never Used  Substance and Sexual Activity  . Alcohol use: Yes    Alcohol/week: 0.0 oz    Comment: 10 Glasses Whiskey/ Week  . Drug use: No  . Sexual activity: Yes  Other Topics Concern  . Not on file  Social History Narrative  . Not on file    Allergies  Allergen Reactions  . Sumatriptan Other (See Comments)    Slurred speech, headache Slurred speech, headache    Outpatient Medications Prior to Visit  Medication Sig Dispense Refill  . Butalbital-APAP-Caffeine 50-300-40 MG CAPS Take by mouth.    . meloxicam (MOBIC) 7.5 MG tablet TAKE 1 TABLET BY MOUTH EVERY DAY    . montelukast (SINGULAIR) 10 MG tablet Take 1 tablet (10 mg total) by mouth at bedtime. 30 tablet 3  . omeprazole (PRILOSEC) 10 MG capsule Take 10 mg by mouth daily.    . traMADol (ULTRAM) 50 MG tablet Take by mouth every 12 (twelve) hours as needed. For migraines PRN    . albuterol (PROVENTIL HFA;VENTOLIN HFA) 108 (90 Base) MCG/ACT inhaler Inhale 2 puffs into the lungs every 6 (six) hours as needed for wheezing or shortness of breath. (Patient not taking: Reported on  09/24/2017) 1 Inhaler 11  . ranitidine (ZANTAC) 75 MG tablet Take 75 mg by mouth daily as needed for heartburn.     Facility-Administered Medications Prior to Visit  Medication Dose Route Frequency Provider Last Rate Last Dose  . albuterol (PROVENTIL) (2.5 MG/3ML) 0.083% nebulizer solution 2.5 mg  2.5 mg Nebulization Once Duanne LimerickJones, Erdine Hulen C, MD        Review of Systems  Constitutional: Negative for chills, fever, malaise/fatigue and weight loss.  HENT: Negative for ear discharge, ear pain, rhinorrhea and sore throat.   Eyes: Negative for blurred vision.  Respiratory: Positive for shortness of breath. Negative for  cough, hemoptysis, sputum production and wheezing.   Cardiovascular: Negative for chest pain, palpitations, orthopnea, claudication, leg swelling, syncope and PND.  Gastrointestinal: Positive for abdominal pain. Negative for blood in stool, constipation, diarrhea, heartburn, melena, nausea and vomiting.  Genitourinary: Negative for dysuria, frequency, hematuria and urgency.  Musculoskeletal: Negative for back pain, joint pain, myalgias and neck pain.  Skin: Negative for rash.  Neurological: Positive for headaches. Negative for dizziness, tingling, sensory change and focal weakness.  Endo/Heme/Allergies: Negative for environmental allergies and polydipsia. Does not bruise/bleed easily.  Psychiatric/Behavioral: Negative for depression and suicidal ideas. The patient is not nervous/anxious and does not have insomnia.      Objective  Vitals:   09/24/17 0923  BP: 98/68  Pulse: 68  Resp: 16  SpO2: 100%  Weight: 148 lb (67.1 kg)  Height: 5\' 10"  (1.778 m)    Physical Exam  Constitutional: She is well-developed, well-nourished, and in no distress. No distress.  HENT:  Head: Normocephalic and atraumatic.  Right Ear: External ear normal.  Left Ear: External ear normal.  Nose: Nose normal.  Mouth/Throat: Oropharynx is clear and moist.  Eyes: Conjunctivae and EOM are normal. Pupils are equal, round, and reactive to light. Right eye exhibits no discharge. Left eye exhibits no discharge.  Neck: Normal range of motion. Neck supple. No JVD present. No thyromegaly present.  Cardiovascular: Normal rate, regular rhythm, normal heart sounds and intact distal pulses. Exam reveals no gallop and no friction rub.  No murmur heard. Pulmonary/Chest: Effort normal and breath sounds normal. She has no wheezes. She has no rales. She exhibits no tenderness.  Abdominal: Soft. Bowel sounds are normal. She exhibits no mass. There is no tenderness. There is no guarding.  Musculoskeletal: Normal range of motion.  She exhibits no edema.  Lymphadenopathy:    She has no cervical adenopathy.  Neurological: She is alert. She has normal reflexes.  Skin: Skin is warm and dry. She is not diaphoretic.  Psychiatric: Mood and affect normal.  Nursing note and vitals reviewed.     Assessment & Plan  Problem List Items Addressed This Visit    None    Visit Diagnoses    Dyspnea, unspecified type    -  Primary   Relevant Orders   D-Dimer, Quantitative   Mild intermittent reactive airway disease without complication       dulera sample   Chronic obstructive pulmonary disease, unspecified COPD type (HCC)       Anxiety       Cigarette nicotine dependence without complication          No orders of the defined types were placed in this encounter. Patient has been advised of the health risks of smoking and counseled concerning cessation of tobacco products. I spent over 3 minutes for discussion and to answer questions.    Dr. Elizabeth Sauereanna Treyana Sturgell [facility]  09/24/17

## 2017-09-25 LAB — D-DIMER, QUANTITATIVE (NOT AT ARMC)

## 2017-11-30 ENCOUNTER — Other Ambulatory Visit: Payer: Self-pay

## 2018-01-02 ENCOUNTER — Other Ambulatory Visit: Payer: Self-pay | Admitting: Family Medicine

## 2018-02-27 ENCOUNTER — Other Ambulatory Visit: Payer: Self-pay | Admitting: Family Medicine

## 2018-04-09 ENCOUNTER — Other Ambulatory Visit: Payer: Self-pay | Admitting: Family Medicine

## 2018-11-04 ENCOUNTER — Ambulatory Visit (INDEPENDENT_AMBULATORY_CARE_PROVIDER_SITE_OTHER): Payer: Medicaid Other | Admitting: Family Medicine

## 2018-11-04 ENCOUNTER — Encounter: Payer: Self-pay | Admitting: Family Medicine

## 2018-11-04 ENCOUNTER — Other Ambulatory Visit: Payer: Self-pay

## 2018-11-04 ENCOUNTER — Other Ambulatory Visit
Admission: RE | Admit: 2018-11-04 | Discharge: 2018-11-04 | Disposition: A | Discharge status: Home / Self Care | Payer: Medicaid Other | Attending: Family Medicine | Admitting: Family Medicine

## 2018-11-04 VITALS — BP 104/70 | HR 99 | Ht 70.0 in | Wt 153.0 lb

## 2018-11-04 DIAGNOSIS — J4522 Mild intermittent asthma with status asthmaticus: Secondary | ICD-10-CM

## 2018-11-04 DIAGNOSIS — M2550 Pain in unspecified joint: Secondary | ICD-10-CM | POA: Insufficient documentation

## 2018-11-04 DIAGNOSIS — F17210 Nicotine dependence, cigarettes, uncomplicated: Secondary | ICD-10-CM | POA: Diagnosis not present

## 2018-11-04 LAB — SEDIMENTATION RATE: Sed Rate: 1 mm/hr (ref 0–22)

## 2018-11-04 MED ORDER — ALBUTEROL SULFATE HFA 108 (90 BASE) MCG/ACT IN AERS: 2.0000 | INHALATION_SPRAY | Freq: Four times a day (QID) | RESPIRATORY_TRACT | 11 refills | Status: AC | PRN

## 2018-11-04 MED ORDER — MELOXICAM 7.5 MG PO TABS: 7.5000 mg | ORAL_TABLET | Freq: Every day | ORAL | 2 refills | Status: DC

## 2018-11-04 MED ORDER — MONTELUKAST SODIUM 10 MG PO TABS: ORAL_TABLET | ORAL | 1 refills | Status: DC

## 2018-11-04 MED ORDER — OMEPRAZOLE 10 MG PO CPDR: 10.0000 mg | DELAYED_RELEASE_CAPSULE | Freq: Every day | ORAL | 3 refills | Status: DC

## 2018-11-04 NOTE — Patient Instructions (Signed)
This information is directly available on the CDC website: DiscoHelp.si.html    Source:CDC Reference to specific commercial products, manufacturers, companies, or trademarks does not constitute its endorsement or recommendation by the U.S. Government, Department of Health and [employer], or Centers for [employer] and Prevention. Steps to Quit Smoking  Smoking tobacco can be harmful to your health and can affect almost every organ in your body. Smoking puts you, and those around you, at risk for developing many serious chronic diseases. Quitting smoking is difficult, but it is one of the best things that you can do for your health. It is never too late to quit. What are the benefits of quitting smoking? When you quit smoking, you lower your risk of developing serious diseases and conditions, such as:  Lung cancer or lung disease, such as COPD.  Heart disease.  Stroke.  Heart attack.  Infertility.  Osteoporosis and bone fractures. Additionally, symptoms such as coughing, wheezing, and shortness of breath may get better when you quit. You may also find that you get sick less often because your body is stronger at fighting off colds and infections. If you are pregnant, quitting smoking can help to reduce your chances of having a baby of low birth weight. How do I get ready to quit? When you decide to quit smoking, create a plan to make sure that you are successful. Before you quit:  Pick a date to quit. Set a date within the next two weeks to give you time to prepare.  Write down the reasons why you are quitting. Keep this list in places where you will see it often, such as on your bathroom mirror or in your car or wallet.  Identify the people, places, things, and activities that make you want to smoke (triggers) and avoid them. Make sure to take these actions: ? Throw away all cigarettes at home, at work, and in your  car. ? Throw away smoking accessories, such as Set designer. ? Clean your car and make sure to empty the ashtray. ? Clean your home, including curtains and carpets.  Tell your family, friends, and coworkers that you are quitting. Support from your loved ones can make quitting easier.  Talk with your health care provider about your options for quitting smoking.  Find out what treatment options are covered by your health insurance. What strategies can I use to quit smoking? Talk with your healthcare provider about different strategies to quit smoking. Some strategies include:  Quitting smoking altogether instead of gradually lessening how much you smoke over a period of time. Research shows that quitting "cold [place]" is more successful than gradually quitting.  Attending in-person counseling to help you build problem-solving skills. You are more likely to have success in quitting if you attend several counseling sessions. Even short sessions of 10 minutes can be effective.  Finding resources and support systems that can help you to quit smoking and remain smoke-free after you quit. These resources are most helpful when you use them often. They can include: ? Online chats with a Veterinary surgeon. ? Telephone quitlines. ? Automotive engineer. ? Support groups or group counseling. ? Text messaging programs. ? Mobile phone applications.  Taking medicines to help you quit smoking. (If you are pregnant or breastfeeding, talk with your health care provider first.) Some medicines contain nicotine and some do not. Both types of medicines help with cravings, but the medicines that include nicotine help to relieve withdrawal symptoms. Your health care provider may  recommend: ? Nicotine patches, gum, or lozenges. ? Nicotine inhalers or sprays. ? Non-nicotine medicine that is taken by mouth. Talk with your health care provider about combining strategies, such as taking medicines while you  are also receiving in-person counseling. Using these two strategies together makes you more likely to succeed in quitting than if you used either strategy on its own. If you are pregnant or breastfeeding, talk with your health care provider about finding counseling or other support strategies to quit smoking. Do not take medicine to help you quit smoking unless told to do so by your health care provider. What things can I do to make it easier to quit? Quitting smoking might feel overwhelming at first, but there is a lot that you can do to make it easier. Take these important actions:  Reach out to your family and friends and ask that they support and encourage you during this time. Call telephone quitlines, reach out to support groups, or work with a counselor for support.  Ask people who smoke to avoid smoking around you.  Avoid places that trigger you to smoke, such as bars, parties, or smoke-break areas at work.  Spend time around people who do not smoke.  Lessen stress in your life, because stress can be a smoking trigger for some people. To lessen stress, try: ? Exercising regularly. ? Deep-breathing exercises. ? Yoga. ? Meditating. ? Performing a body scan. This involves closing your eyes, scanning your body from head to toe, and noticing which parts of your body are particularly tense. Purposefully relax the muscles in those areas.  Download or purchase mobile phone or tablet apps (applications) that can help you stick to your quit plan by providing reminders, tips, and encouragement. There are many free apps, such as QuitGuide from the [employer] developer for Disease Control and Prevention). You can find other support for quitting smoking (smoking cessation) through smokefree.gov and other websites. How will I feel when I quit smoking? Within the first 24 hours of quitting smoking, you may start to feel some withdrawal symptoms. These symptoms are usually most noticeable 2-3 days after  quitting, but they usually do not last beyond 2-3 weeks. Changes or symptoms that you might experience include:  Mood swings.  Restlessness, anxiety, or irritation.  Difficulty concentrating.  Dizziness.  Strong cravings for sugary foods in addition to nicotine.  Mild weight gain.  Constipation.  Nausea.  Coughing or a sore throat.  Changes in how your medicines work in your body.  A depressed mood.  Difficulty sleeping (insomnia). After the first 2-3 weeks of quitting, you may start to notice more positive results, such as:  Improved sense of smell and taste.  Decreased coughing and sore throat.  Slower heart rate.  Lower blood pressure.  Clearer skin.  The ability to breathe more easily.  Fewer sick days. Quitting smoking is very challenging for most people. Do not get discouraged if you are not successful the first time. Some people need to make many attempts to quit before they achieve long-term success. Do your best to stick to your quit plan, and talk with your health care provider if you have any questions or concerns. This information is not intended to replace advice given to you by your health care provider. Make sure you discuss any questions you have with your health care provider. Document Released: 07/11/2001 Document Revised: 02/20/2017 Document Reviewed: 12/01/2014 Elsevier Interactive Patient Education  2019 [employer].

## 2018-11-04 NOTE — Progress Notes (Signed)
Date:  11/04/2018   Name:  Sara Joseph   DOB:  1976-02-11   MRN:  [id number]   Chief Complaint: tennis elbow (refill meloxicam); Gastroesophageal Reflux (omeprazole refill); Asthma (refill singulair); and Depression (PHQ9=13- was seeing psychiatry but hasn't been in about "6 months or so")  Gastroesophageal Reflux  She reports no abdominal pain, no belching, no chest pain, no choking, no coughing, no dysphagia, no early satiety, no globus sensation, no heartburn, no hoarse voice, no nausea, no sore throat, no stridor, no tooth decay, no water brash or no wheezing. This is a chronic problem. The current episode started more than 1 year ago. The problem occurs constantly. The problem has been waxing and waning. The symptoms are aggravated by certain foods. Pertinent negatives include no anemia, fatigue, melena, muscle weakness, orthopnea or weight loss. There are no known risk factors. She has tried a PPI for the symptoms. The treatment provided moderate relief. Past procedures do not include an abdominal ultrasound, an EGD, esophageal manometry, esophageal pH monitoring, H. pylori antibody titer or a UGI.  Asthma  She complains of shortness of breath. There is no chest tightness, cough, difficulty breathing, frequent throat clearing, hemoptysis, hoarse voice, sputum production or wheezing. This is a chronic problem. The current episode started more than 1 year ago. The problem occurs intermittently. The problem has been waxing and waning. Pertinent negatives include no appetite change, chest pain, dyspnea on exertion, ear congestion, ear pain, fever, headaches, heartburn, malaise/fatigue, myalgias, nasal congestion, orthopnea, PND, postnasal drip, rhinorrhea, sneezing, sore throat, sweats, trouble swallowing or weight loss. Her symptoms are aggravated by nothing. Her symptoms are alleviated by beta-agonist. She reports moderate improvement on treatment. Her symptoms are not alleviated by beta-agonist.  Risk factors for lung disease include animal exposure. Her past medical history is significant for asthma. There is no history of bronchiectasis, bronchitis, emphysema or pneumonia.  Depression         This is a chronic problem.  The current episode started more than 1 year ago.   The onset quality is gradual.   The problem occurs intermittently.  The problem has been waxing and waning since onset.  Associated symptoms include decreased interest, body aches and sad.  Associated symptoms include no decreased concentration, no fatigue, no helplessness, no hopelessness, does not have insomnia, not irritable, no restlessness, no appetite change, no myalgias, no headaches, no indigestion and no suicidal ideas.     Exacerbated by: medical issues.  Past treatments include SSRIs - Selective serotonin reuptake inhibitors.  Compliance with treatment is variable.   Review of Systems  Constitutional: Negative.  Negative for appetite change, chills, fatigue, fever, malaise/fatigue, unexpected weight change and weight loss.  HENT: Negative for congestion, ear discharge, ear pain, hoarse voice, postnasal drip, rhinorrhea, sinus pressure, sneezing, sore throat and trouble swallowing.   Eyes: Negative for photophobia, pain, discharge, redness and itching.  Respiratory: Positive for shortness of breath. Negative for cough, hemoptysis, sputum production, choking, wheezing and stridor.   Cardiovascular: Negative for chest pain, dyspnea on exertion and PND.  Gastrointestinal: Negative for abdominal pain, blood in stool, constipation, diarrhea, dysphagia, heartburn, melena, nausea and vomiting.  Endocrine: Negative for cold intolerance, heat intolerance, polydipsia, polyphagia and polyuria.  Genitourinary: Negative for dysuria, flank pain, frequency, hematuria, menstrual problem, pelvic pain, urgency, vaginal bleeding and vaginal discharge.  Musculoskeletal: Negative for arthralgias, back pain, myalgias and muscle weakness.   Skin: Negative for rash.  Allergic/Immunologic: Negative for environmental allergies and food allergies.  Neurological: Negative for dizziness, weakness, light-headedness, numbness and headaches.  Hematological: Negative for adenopathy. Does not bruise/bleed easily.  Psychiatric/Behavioral: Positive for depression. Negative for decreased concentration, dysphoric mood and suicidal ideas. The patient is not nervous/anxious and does not have insomnia.     Patient Active Problem List   Diagnosis Date Noted  . Asthma 10/27/2015  . Hydradenitis 09/23/2015  . Headache, migraine 02/25/2014    Allergies  Allergen Reactions  . Sumatriptan Other (See Comments)    Slurred speech, headache Slurred speech, headache    Past Surgical History:  Procedure Laterality Date  . TUBAL LIGATION  2007    Social History   Tobacco Use  . Smoking status: Heavy Tobacco Smoker    Packs/day: 0.50    Types: Cigarettes  . Smokeless tobacco: Never Used  Substance Use Topics  . Alcohol use: Yes    Alcohol/week: 0.0 standard drinks    Comment: 10 Glasses Whiskey/ Week  . Drug use: No     Medication list has been reviewed and updated.  Current Meds  Medication Sig  . gabapentin (NEURONTIN) 300 MG capsule Take 300 mg by mouth 3 (three) times daily.  . meloxicam (MOBIC) 7.5 MG tablet TAKE 1 TABLET BY MOUTH EVERY DAY  . montelukast (SINGULAIR) 10 MG tablet TAKE 1 TABLET(10 MG) BY MOUTH AT BEDTIME  . omeprazole (PRILOSEC) 10 MG capsule Take 10 mg by mouth daily.   Current Facility-Administered Medications for the 11/04/18 encounter (Office Visit) with Juline Patch, MD  Medication  . albuterol (PROVENTIL) (2.5 MG/3ML) 0.083% nebulizer solution 2.5 mg    PHQ 2/9 Scores 11/04/2018 11/04/2018 08/27/2017  PHQ - 2 Score 3 0 1  PHQ- 9 Score 13 0 3    BP Readings from Last 3 Encounters:  11/04/18 104/70  09/24/17 98/68  08/27/17 102/60    Physical Exam Vitals signs and nursing note reviewed.   Constitutional:      General: She is not irritable.She is not in acute distress.    Appearance: She is not diaphoretic.  HENT:     Head: Normocephalic and atraumatic.     Right Ear: External ear normal.     Left Ear: External ear normal.     Nose: Nose normal.  Eyes:     General:        Right eye: No discharge.        Left eye: No discharge.     Conjunctiva/sclera: Conjunctivae normal.     Pupils: Pupils are equal, round, and reactive to light.  Neck:     Musculoskeletal: Normal range of motion and neck supple.     Thyroid: No thyromegaly.     Vascular: No JVD.  Cardiovascular:     Rate and Rhythm: Normal rate and regular rhythm.     Heart sounds: Normal heart sounds. No murmur. No friction rub. No gallop.   Pulmonary:     Effort: Pulmonary effort is normal.     Breath sounds: Normal breath sounds. Decreased air movement present. No decreased breath sounds, wheezing, rhonchi or rales.  Abdominal:     General: Bowel sounds are normal.     Palpations: Abdomen is soft. There is no mass.     Tenderness: There is no abdominal tenderness. There is no guarding.  Musculoskeletal: Normal range of motion.  Lymphadenopathy:     Cervical: No cervical adenopathy.  Skin:    General: Skin is warm and dry.  Neurological:     Mental Status: She is alert.  Deep Tendon Reflexes: Reflexes are normal and symmetric.     Wt Readings from Last 3 Encounters:  11/04/18 153 lb (69.4 kg)  09/24/17 148 lb (67.1 kg)  08/27/17 151 lb (68.5 kg)    BP 104/70   Pulse 99   Ht '5\' 10"'  (1.778 m)   Wt 153 lb (69.4 kg)   SpO2 97%   BMI 21.95 kg/m   Assessment and Plan: 1. Mild intermittent asthma with status asthmaticus Chronic.  Mild intermittent.  Patient has since younger age.  Will suggest using albuterol inhaler 2 puffs every 6 hours in addition to her Advair. - albuterol (PROVENTIL HFA;VENTOLIN HFA) 108 (90 Base) MCG/ACT inhaler; Inhale 2 puffs into the lungs every 6 (six) hours as needed  for wheezing or shortness of breath.  Dispense: 1 Inhaler; Refill: 11  2. Arthralgia, unspecified joint Patient brought up at the end of the evaluation now she has been having arthralgias in her mother and her have been searching the Internet and has come up with lupus as a possibility.  Have told patient that there are other more likely causes for her arthralgias however we will do a sed rate and a lupus panel.  We also gave information to the patient concerning rheumatology/Dr. Cristi Loron. - Sed Rate (ESR) - Lupus anticoagulant panel 3.  Depression.  Patient has a PHQ score of 13.  Between the patient being on a vacation then the psychiatrist been on a vacation and who knows what patient has not had any psychiatric care over the past 6 months.  Patient has been encouraged to return to her psychiatrist that she previously had a status while on Medicaid.

## 2018-11-05 LAB — LUPUS ANTICOAGULANT PANEL
DRVVT: 34.3 s (ref 0.0–47.0)
PTT Lupus Anticoagulant: 32.6 s (ref 0.0–51.9)

## 2019-02-04 ENCOUNTER — Other Ambulatory Visit: Payer: Self-pay

## 2019-02-04 ENCOUNTER — Telehealth: Payer: Self-pay

## 2019-02-04 MED ORDER — MELOXICAM 7.5 MG PO TABS: 7.5000 mg | ORAL_TABLET | Freq: Every day | ORAL | 0 refills | Status: DC

## 2019-02-04 NOTE — Telephone Encounter (Signed)
Pt called requesting meloxicam. She was seeing pain management who, per pt, has told her that "since you are stable, primary care can take over your meds." I asked her about her Tramadol and gabapentin as well as butalbital. She says that she doesn't get Tramadol, but every once in a while. I explained to her that Dr Ronnald Ramp could not prescribe these meds, she would need to return to pain management for them. We will send in #30 of Meloxicam for her to get back in with pain doctor.

## 2019-06-06 IMAGING — MR MR [PERSON_NAME]*[PERSON_NAME]* W/O CM
5 of 6 series · 30 of 40 positions shown · non-contrast
Comparison: None.

CLINICAL DATA: Sprain of the left first MCP joint.

EXAM:
MRI OF THE LEFT FINGERS WITHOUT CONTRAST
TECHNIQUE: Multiplanar, multisequence MR imaging of the left thumb was
performed. No intravenous contrast was administered.

[Series 3: T1 · axial · 4.0mm · 0.38mm/px · z∈[-48,+43]mm · 7 of 24 slices shown]
[im 1/24]
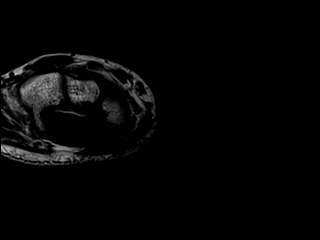
[im 3/24]
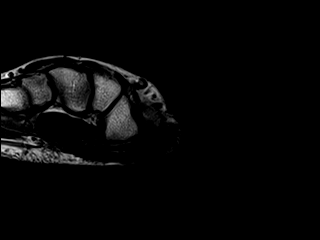
[im 8/24]
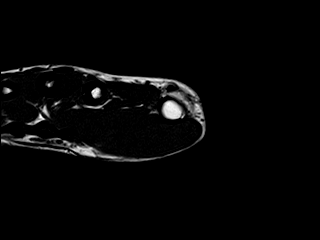
[im 11/24]
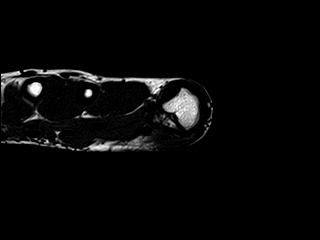
[im 13/24]
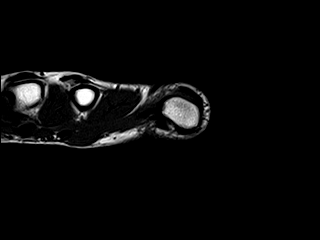
[im 16/24]
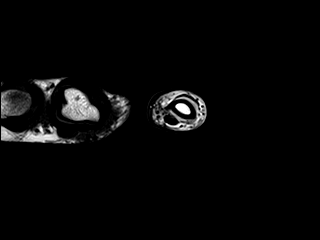
[im 21/24]
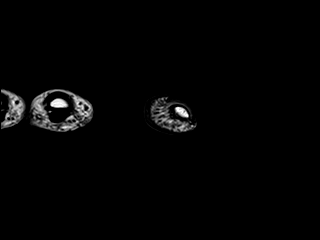

[Series 4: T2 fat-sat · axial · 4.0mm · 0.47mm/px · z∈[-48,+56]mm · 8 of 24 slices shown (1 of 3)]
[im 1/24]
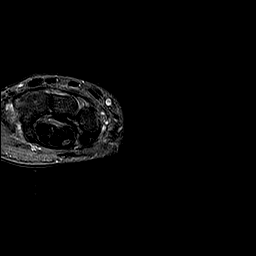
[im 3/24]
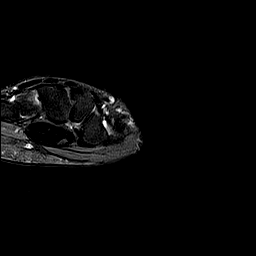
[im 8/24]
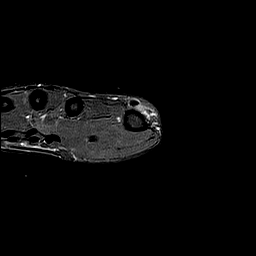
[im 11/24]
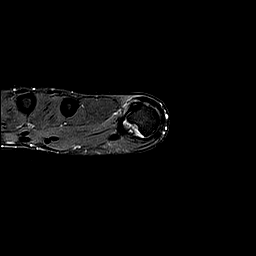
[im 13/24]
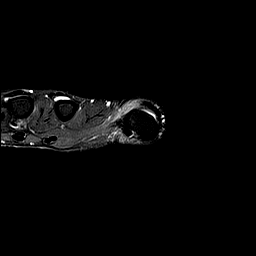
[im 16/24]
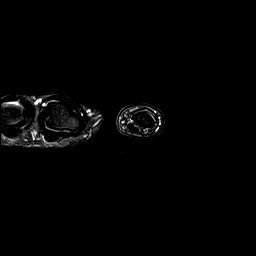
[im 21/24]
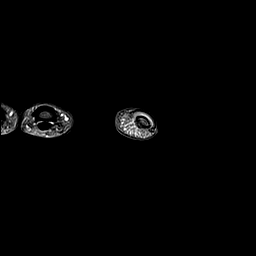
[im 24/24]
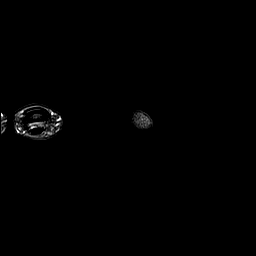

[Series 5: T2 fat-sat · coronal · 2.0mm · 0.23mm/px · 5 of 14 slices shown (2 of 3)]
[im 1/14]
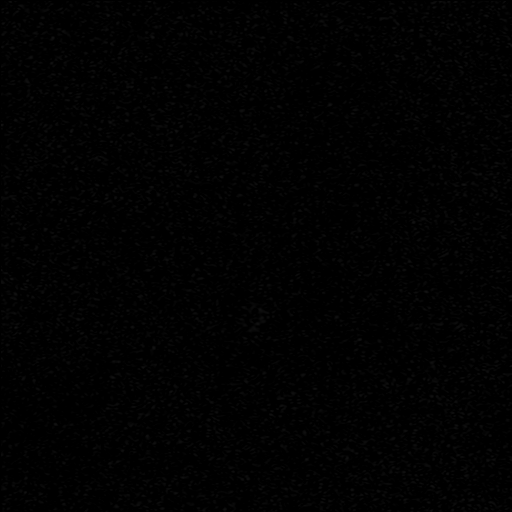
[im 4/14]
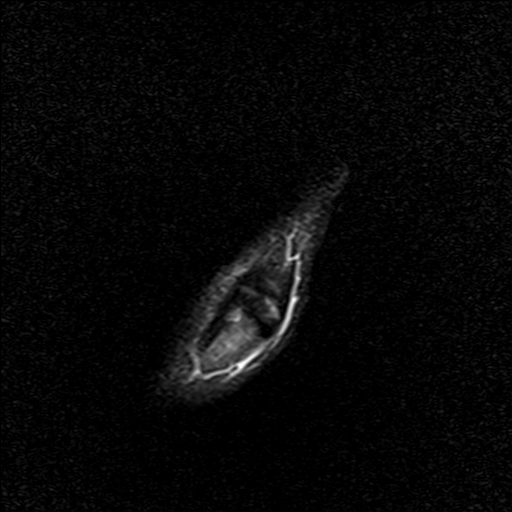
[im 7/14]
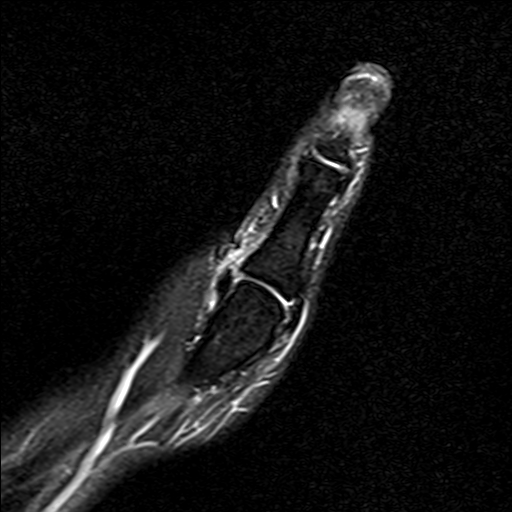
[im 10/14]
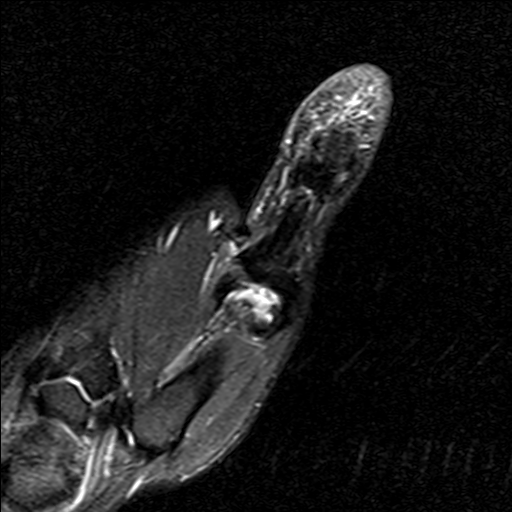
[im 14/14]
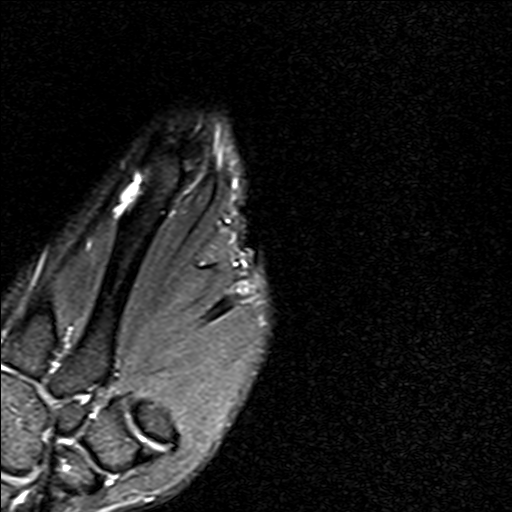

[Series 6: PD fat-sat · coronal · 2.0mm · 0.23mm/px · 5 of 14 slices shown]
[im 1/14]
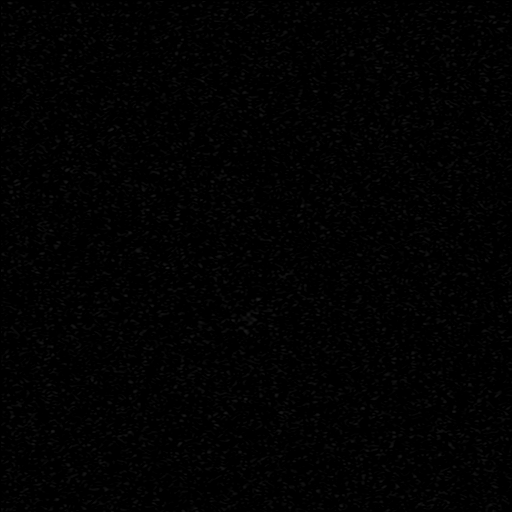
[im 4/14]
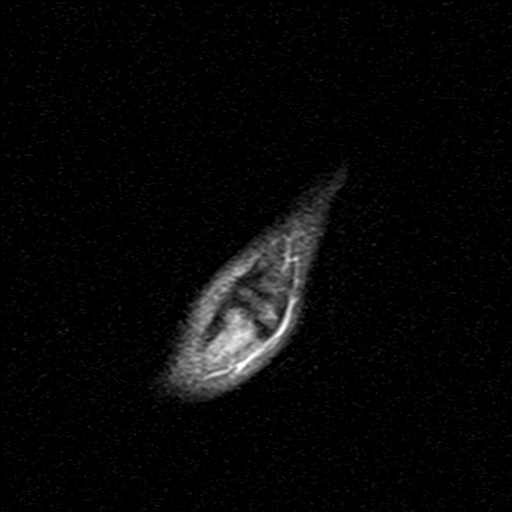
[im 7/14]
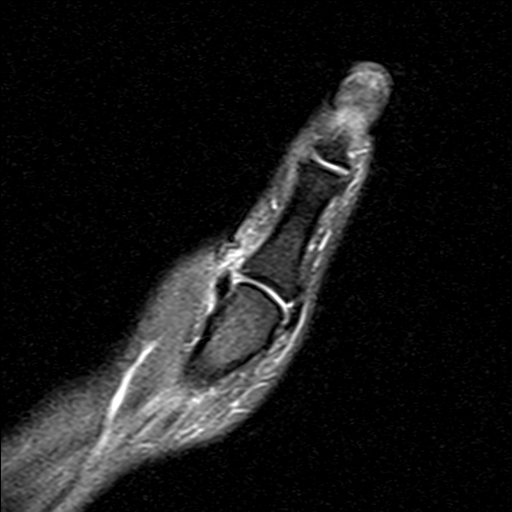
[im 10/14]
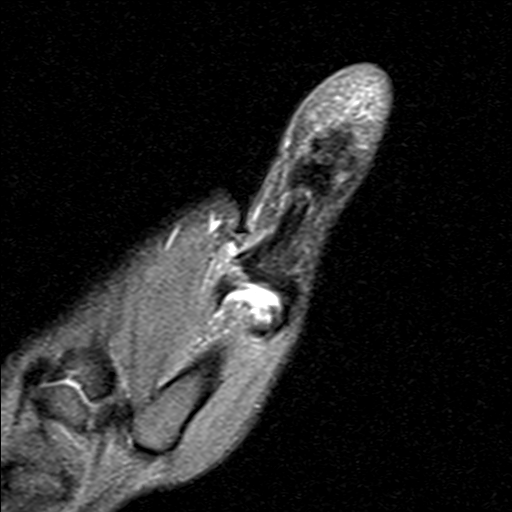
[im 14/14]
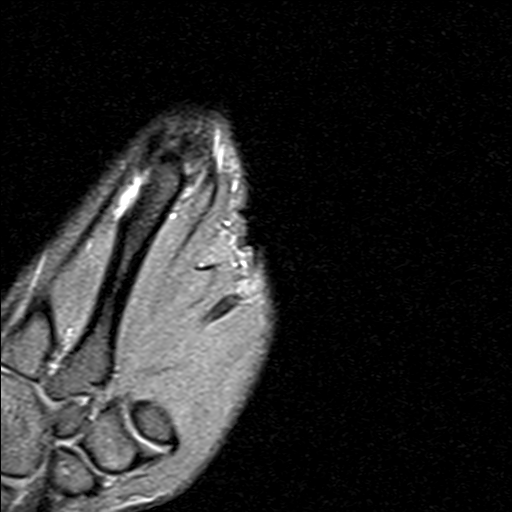

[Series 7: T2 fat-sat · oblique · 2.0mm · 0.25mm/px · 5 of 13 slices shown (3 of 3)]
[im 1/13]
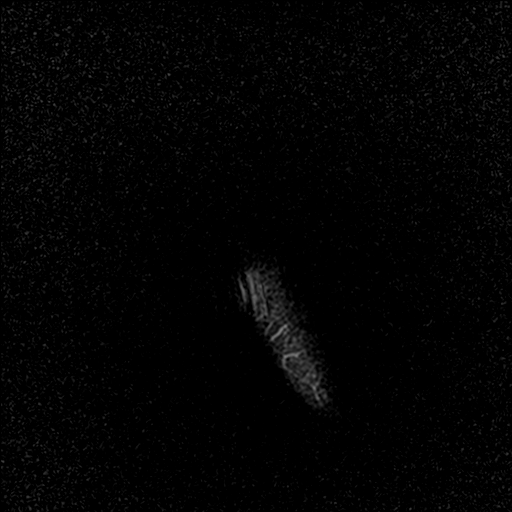
[im 4/13]
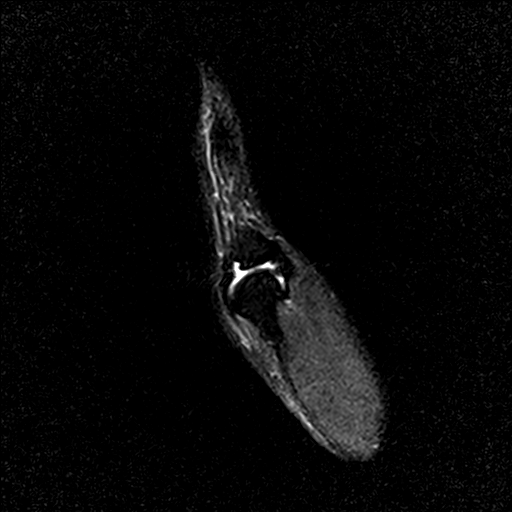
[im 7/13]
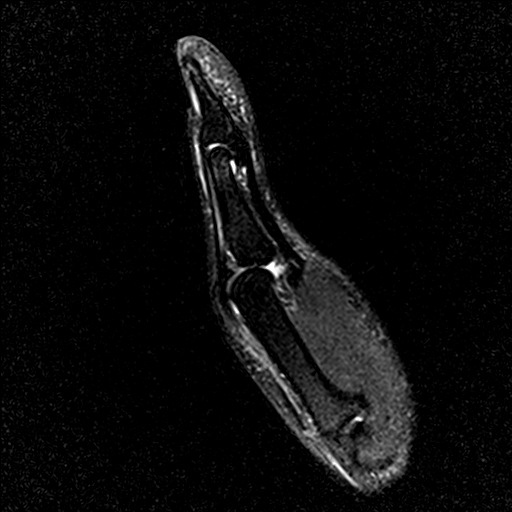
[im 10/13]
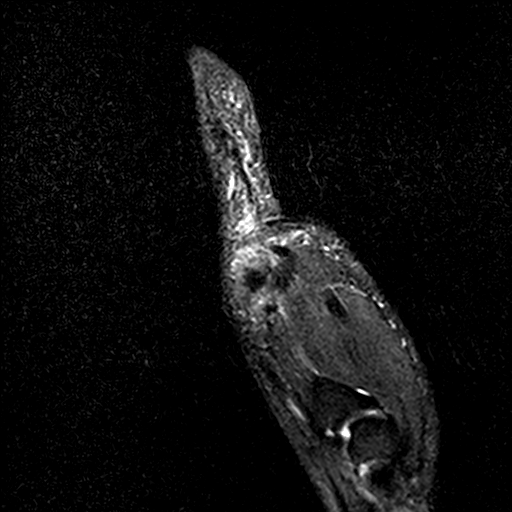
[im 13/13]
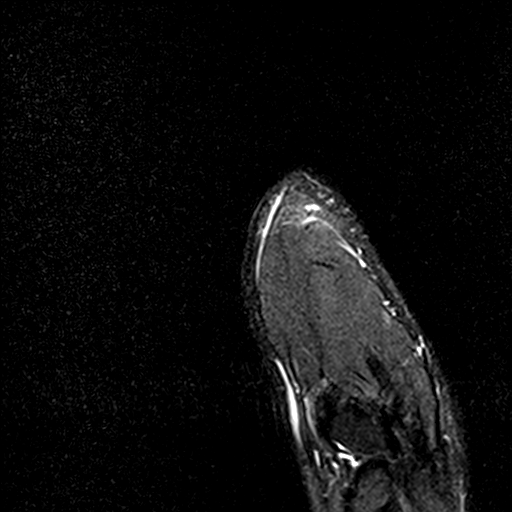

[30 of 40 positions shown; findings below may reference images not displayed]

FINDINGS: Bones/Joint/Cartilage

Normal.

Ligaments

There is a complete tear of the distal aspect of the ulnar
collateral ligament at the first MCP joint. Adjacent soft tissue
edema.

Muscles and Tendons

Normal.
IMPRESSION: Complete tear of the distal ulnar collateral ligament at the first
MCP joint. " Gamekeeper's thumb". The appearance is consistent with
a Stener lesion.

## 2019-08-05 ENCOUNTER — Ambulatory Visit: Payer: Medicaid Other | Attending: Internal Medicine

## 2019-08-05 DIAGNOSIS — Z20822 Contact with and (suspected) exposure to covid-19: Secondary | ICD-10-CM

## 2019-08-07 LAB — NOVEL CORONAVIRUS, NAA: SARS-CoV-2, NAA: NOT DETECTED

## 2020-10-15 HISTORY — PX: LUMBAR DISC SURGERY: SHX700

## 2021-07-15 ENCOUNTER — Other Ambulatory Visit: Payer: Self-pay | Admitting: Gastroenterology

## 2021-07-15 DIAGNOSIS — R1314 Dysphagia, pharyngoesophageal phase: Secondary | ICD-10-CM

## 2021-07-15 DIAGNOSIS — R1030 Lower abdominal pain, unspecified: Secondary | ICD-10-CM

## 2021-07-26 ENCOUNTER — Ambulatory Visit
Admission: RE | Admit: 2021-07-26 | Discharge: 2021-07-26 | Disposition: A | Discharge status: Home / Self Care | Payer: Medicaid Other | Source: Ambulatory Visit | Attending: Gastroenterology | Admitting: Gastroenterology

## 2021-07-26 DIAGNOSIS — R1319 Other dysphagia: Secondary | ICD-10-CM | POA: Insufficient documentation

## 2021-07-26 DIAGNOSIS — R1030 Lower abdominal pain, unspecified: Secondary | ICD-10-CM | POA: Insufficient documentation

## 2021-07-26 DIAGNOSIS — R1314 Dysphagia, pharyngoesophageal phase: Secondary | ICD-10-CM | POA: Insufficient documentation

## 2021-07-26 NOTE — H&P (Signed)
Sara Joseph is a 45 y.o. female here for [facility] / bilateral salpingectomy for menorrhagia . Pt is here for follow up  for lower abd pain and worsening MMR  sent to me by PCP  For the last 5-10 yrs patient has c/o of bilateral lower pelvic pain . Sometime worse with urination . Now pain has started 1-2 weeks before cycle . + Dyspareunia and feels that something is blocking with deep thrust .  No nocturia . She has undergone pelvic floor PT 4 yrs ago which helped with dyspareunia  Now painful sex is about the same as before .    Menses irregular and several per month at least 8+ days . + clots . Sister with uterine cancer.   Prior h/o + HPV  . Last pap 3 yrs ago   G3P3  All svd embx 06/28/21 - proliferative negative  PAP- LGSIL  Pt does admit to migraines with aura .  She also has PMDD symptoms . She is on Cymbalta for depression 20 mg    Past Medical History:  has a past medical history of Asthma without status asthmaticus, unspecified, GERD (gastroesophageal reflux disease), Hypertensive disorder (02/10/2021), Migraine, and Migraines.  Past Surgical History:  has a past surgical history that includes Laparoscopic Tubal Ligation; Tubal ligation; and Myringotomy & Tubes. Family History: family history includes Allergies in her father and mother; Arthritis in her father, maternal grandfather, maternal grandmother, and mother; Asthma in her maternal grandfather; Bipolar disorder in her father; Breast cancer in her maternal aunt; Depression in her brother and sister; Diabetes in her mother; High blood pressure (Hypertension) in her mother; Lupus in her cousin; Tuberculosis in her sister. Social History:  reports that she has been smoking cigarettes. She has a 21.00 pack-year smoking history. She has never used smokeless tobacco. She reports current alcohol use. She reports that she does not use drugs. OB/GYN History:          OB History     Gravida  3   Para  3   Term  3   Preterm      AB       Living  3      SAB      IAB      Ectopic      Molar      Multiple      Live Births  3        Obstetric Comments  1995:  NSVD @ term 58:  NSVD @ term; [place] 2007:  NSVD @ term; pp BTL           Allergies: is allergic to nortriptyline, adhesive, and opioids - morphine analogues. Medications:   Current Outpatient Medications:    ADVAIR DISKUS 250-50 mcg/dose diskus inhaler, INHALE 1 PUFF BY MOUTH TWICE DAILY, Disp: , Rfl:    albuterol 90 mcg/actuation inhaler, Inhale into the lungs, Disp: , Rfl:    busPIRone (BUSPAR) 7.5 MG tablet, Take 1 tablet (7.5 mg total) by mouth 2 (two) times daily, Disp: 60 tablet, Rfl: 6   cholecalciferol (VITAMIN D3) 1000 unit tablet, Take 1,000 Units by mouth once daily, Disp: , Rfl:    cyanocobalamin (VITAMIN B12) 1000 MCG tablet, Take 1,000 mcg by mouth once daily, Disp: , Rfl:    cyclobenzaprine (FLEXERIL) 10 MG tablet, cyclobenzaprine 10 mg tablet  TAKE 1 TABLET BY MOUTH TWICE DAILY AS NEEDED, Disp: , Rfl:    cycloSPORINE (RESTASIS) 0.05 % ophthalmic emulsion, Restasis 0.05 % eye drops  in a dropperette, Disp: , Rfl:    DULoxetine (CYMBALTA) 20 MG DR capsule, Take 2 capsules (40 mg total) by mouth once daily, Disp: 60 capsule, Rfl: 6   fluticasone propionate (FLONASE) 50 mcg/actuation nasal spray, Place 2 sprays into both nostrils once daily, Disp: 16 g, Rfl: 1   folic acid (FOLVITE) 1 MG tablet, Take 1 tablet (1 mg total) by mouth once daily, Disp: 90 tablet, Rfl: 3   hydrOXYchloroQUINE (PLAQUENIL) 200 mg tablet, Take 200 mg by mouth 2 (two) times daily, Disp: , Rfl:    magnesium 250 mg Tab, Take 2 tablets by mouth once daily., Disp: , Rfl:    meloxicam (MOBIC) 15 MG tablet, meloxicam 15 mg tablet  TAKE 1 TABLET BY MOUTH EVERY DAY, Disp: , Rfl:    methotrexate (RHEUMATREX) 2.5 MG tablet, Take 5 tablets (12.5 mg total) by mouth every 7 (seven) days All on the same day, Disp: 20 tablet, Rfl: 2   NORLYDA 0.35 mg tablet, TAKE 1 TABLET(0.35 MG) BY  MOUTH EVERY DAY, Disp: 84 tablet, Rfl: 3   omeprazole (PRILOSEC) 40 MG DR capsule, Take 1 capsule (40 mg total) by mouth once daily, Disp: 90 capsule, Rfl: 3   topiramate (TOPAMAX) 50 MG tablet, Take 1 tablet (50 mg total) by mouth 2 (two) times daily, Disp: 60 tablet, Rfl: 11   gabapentin (NEURONTIN) 300 MG capsule, TAKE 1 CAPSULE(300 MG) BY MOUTH THREE TIMES DAILY (Patient not taking: Reported on 07/21/2021), Disp: 90 capsule, Rfl: 1   Review of Systems: General:                      No fatigue or weight loss Eyes:                           No vision changes Ears:                            No hearing difficulty Respiratory:                No cough or shortness of breath Pulmonary:                  No asthma or shortness of breath Cardiovascular:           No chest pain, palpitations, dyspnea on exertion Gastrointestinal:          No abdominal bloating, chronic diarrhea, constipations, masses, pain or hematochezia Genitourinary:             No hematuria, dysuria, abnormal vaginal discharge, pelvic pain, Menometrorrhagia Lymphatic:                   No swollen lymph nodes Musculoskeletal:         No muscle weakness Neurologic:                  No extremity weakness, syncope, seizure disorder Psychiatric:                  No history of depression, delusions or suicidal/homicidal ideation      Exam:       Vitals:    07/21/21 1115  BP: 119/79  Pulse: 79      Body mass index is 24.07 kg/m.   WDWN white/ female in NAD   Lungs: CTA  CV : RRR without murmur   Breast: exam done in  sitting and lying position : No dimpling or retraction, no dominant mass, no spontaneous discharge, no axillary adenopathy Neck:  no thyromegaly Abdomen: soft , no mass, normal active bowel sounds,  non-tender, no rebound tenderness Pelvic: tanner stage 5 ,  External genitalia: vulva /labia no lesions Urethra: no prolapse Vagina: white d/c adequate room for TVH  Cervix: no lesions, no cervical motion  tenderness   Second degree uterine descensus Uterus: normal size shape and contour, non-tender Adnexa: no mass,  non-tender   Rectovaginal:  Saline infusion sonohysterography: betadine prep to the cervix followed by placement of the HSG catheter into the endometrial canal . Sterile H2O is injected while performing a transvaginal u/s . Findings: Saline  [place]    UTX 8 cm    Ut wnl          Endometrium=7.81 mm   bil ovs wnl   No endometrial pathology noted    Impression:    The primary encounter diagnosis was Menorrhagia with irregular cycle. Diagnoses of PMDD (premenstrual dysphoric disorder), Uterus descensus, Dyspareunia, female, and Pelvic pain in female were also pertinent to this visit. Cervical dysplasia (LGSIL)       Plan:    Spoke to her about tx options for bleeding : IUD , , lysteda ablation  or hysterectomy , ( combination  OCP are relative contraindicated for her with her migraine hx) For dyspareunia / pain  The best option  Hysterectomy . After full discussion she has elected for [facility]  And bilateral salpingectomy  She is aware that ovaries remaining in situ will still cause her to have cyclic mood swings . I spoke to her about the possiblity of increasing her cymbalta with her PCP or possible switch to a straight SSRI med .  she is going to stop OCPs that her other gyn started her on

## 2021-07-26 NOTE — Therapy (Signed)
[facility] DIAGNOSTIC RADIOLOGY [address] [place], [place], [address] Phone: [phone]   Fax:     Modified Barium Swallow  Patient Details  Name: Sara Joseph MRN: [id number] Date of Birth: [birth year] No data recorded  Encounter Date: 07/26/2021   End of Session - 07/26/21 1719     Visit Number 1    Number of Visits 1    Date for SLP Re-Evaluation 07/26/21    SLP Start Time 1325    SLP Stop Time  1425    SLP Time Calculation (min) 60 min    Activity Tolerance Patient tolerated treatment well             Past Medical History:  Diagnosis Date   Asthma    GERD (gastroesophageal reflux disease)    Migraine     Past Surgical History:  Procedure Laterality Date   TUBAL LIGATION  2007    There were no vitals filed for this visit.     Subjective: Patient behavior: (alertness, ability to follow instructions, etc.): Chief complaint: dysphagia. Pt reported long-standing h/o GERD; s/s of Reflux including discomfort after meals and early satiety during meals.  Pt also endorsed Migraines(followed by Neurologist), Dyspnea(followed by MD), anxiety/depression. No reports of weight loss or changes in diet recently. No other medical issues per chart notes, or pt report.   OM Exam: WFL w/ no lingual/labial weakness. Cough+; Native Dentition+; Speech clear.   Objective:  Radiological Procedure: A videoflouroscopic evaluation of oral-preparatory, reflex initiation, and pharyngeal phases of the swallow was performed; as well as a screening of the upper esophageal phase.  POSTURE: upright VIEW: lateral COMPENSATORY STRATEGIES: None indicated BOLUSES ADMINISTERED:  Thin Liquid: 2 tsps; 5 trials via cup  Nectar-thick Liquid: 1 tsp; 3 trials via cup  Honey-thick Liquid: NT  Puree: 2 trials  Mechanical Soft: 1 trial RESULTS OF EVALUATION: ORAL PREPARATORY PHASE: (The lips, tongue, and velum are observed for strength and  coordination)       **Overall Severity Rating: WFL.  SWALLOW INITIATION/REFLEX: (The reflex is normal if triggered by the time the bolus reached the base of the tongue)  **Overall Severity Rating: [facility].  PHARYNGEAL PHASE: (Pharyngeal function is normal if the bolus shows rapid, smooth, and continuous transit through the pharynx and there is no pharyngeal residue after the swallow)  **Overall Severity Rating: [facility].  LARYNGEAL PENETRATION: (Material entering into the laryngeal inlet/vestibule but not aspirated): NONE ASPIRATION: NONE ESOPHAGEAL PHASE: (Screening of the upper esophagus): no dysmotility appreciated in the viewable Cervical Esophagus  ASSESSMENT: Pt appears to present w/ No oropharyngeal phase dysphagia; No neuromuscular deficits of swallowing. No aspiration or penetration of po trials was noted to occur during this study. No bolus stasis/dysmotility was noted in the viewable Cervical Esophagus. Based on pt's verbal report of s/s of Reflux and her h/o GERD, recommend f/u w/ GI for further assessment and management as needed.   During the oral phase, timely bolus management and control of bolus propulsion for A-P transfer occurred. Oral clearing achieved w/ all trial consistencies.  During the pharyngeal phase, Timely pharyngeal swallow initiation noted w/ all trial consistencies. No aspiration or penetration occurred; airway closure appeared timely, tight. No pharyngeal residue remained post swallow indicating adequate laryngeal excursion and anterior movements w/ adequate pharyngeal pressure during the swallow.   Discussed results of MBSS, video viewed w/ pt and questions answered. Handouts given on general information re: Reflux and Esophageal motility precautions/recommendations. Recommended use of a Puree  for swallowing Pills if helpful, especially w/ Larger Pills.  Recommended f/u w/ GI for further assessment, management, and education of GERD/Reflux s/s, per pt's c/o of issues.  Briefly discussed impact of Anxiety on eating/oral intake in general; recommend f/u w/ MD re: further and/or need for referral.    PLAN/RECOMMENDATIONS:  A. Diet: continue Regular diet w/ Thin liquids; Pills WHOLE in Puree if easier for swallowing, especially larger pills  B. Swallowing Precautions: general aspiration precautions; REFLUX precautions  C. Recommended consultation to: GI for further assessment, management, and education of GERD/Reflux s/s, per pt's c/o of issues.  D. Therapy recommendations: None  E. Results and recommendations were discussed w/ pt; video viewed together and questions answered. Pt agreed.      Other dysphagia - Plan: DG SWALLOW FUNC SPEECH PATH, DG SWALLOW FUNC SPEECH PATH  Lower abdominal pain - Plan: DG SWALLOW FUNC SPEECH PATH, DG SWALLOW [facility] SPEECH PATH        Problem List Patient Active Problem List   Diagnosis Date Noted   Asthma 10/27/2015   Hydradenitis 09/23/2015   Headache, migraine 02/25/2014         Jerilynn Som, MS, CCC-SLP Speech Language Pathologist Rehab Services [phone] [place], CCC-SLP 07/26/2021, 5:21 PM  [facility] DIAGNOSTIC RADIOLOGY [address] [place], [place], [address] Phone: [phone]   Fax:     Name: KHAMILLE BEYNON MRN: [id number] Date of Birth: 1976/05/24

## 2021-07-28 ENCOUNTER — Other Ambulatory Visit: Payer: Self-pay | Admitting: Obstetrics and Gynecology

## 2021-07-28 DIAGNOSIS — Z1231 Encounter for screening mammogram for malignant neoplasm of breast: Secondary | ICD-10-CM

## 2021-08-04 ENCOUNTER — Encounter
Admission: RE | Admit: 2021-08-04 | Discharge: 2021-08-04 | Disposition: A | Discharge status: Home / Self Care | Payer: Medicaid Other | Source: Ambulatory Visit | Attending: Obstetrics and Gynecology | Admitting: Obstetrics and Gynecology

## 2021-08-04 ENCOUNTER — Other Ambulatory Visit: Payer: Self-pay

## 2021-08-04 DIAGNOSIS — Z01818 Encounter for other preprocedural examination: Secondary | ICD-10-CM

## 2021-08-04 DIAGNOSIS — Z01812 Encounter for preprocedural laboratory examination: Secondary | ICD-10-CM | POA: Diagnosis not present

## 2021-08-04 HISTORY — DX: Raynaud's syndrome without gangrene: I73.00

## 2021-08-04 HISTORY — DX: Deficiency of other specified B group vitamins: E53.8

## 2021-08-04 HISTORY — DX: Vitamin D deficiency, unspecified: E55.9

## 2021-08-04 HISTORY — DX: Inflammatory polyarthropathy: M06.4

## 2021-08-04 HISTORY — DX: Myalgia, other site: M79.18

## 2021-08-04 LAB — BASIC METABOLIC PANEL
Anion gap: 5 (ref 5–15)
BUN: 17 mg/dL (ref 6–20)
CO2: 24 mmol/L (ref 22–32)
Calcium: 8.9 mg/dL (ref 8.9–10.3)
Chloride: 106 mmol/L (ref 98–111)
Creatinine, Ser: 0.79 mg/dL (ref 0.44–1.00)
GFR, Estimated: >60 mL/min (ref >60)
Glucose, Bld: 106 mg/dL — ABNORMAL HIGH (ref 70–99)
Potassium: 4.1 mmol/L (ref 3.5–5.1)
Sodium: 135 mmol/L (ref 135–145)

## 2021-08-04 LAB — TYPE AND SCREEN
ABO/RH(D): O NEG
Antibody Screen: NEGATIVE

## 2021-08-04 NOTE — Patient Instructions (Addendum)
Your procedure is scheduled on: Friday, January 13 Report to the Registration Desk on the 1st floor of the [facility] To find out your arrival time, please call [phone] between 1PM - 3PM on: Thursday, January 12  REMEMBER: Instructions that are not followed completely may result in serious medical risk, up to and including death; or upon the discretion of your surgeon and anesthesiologist your surgery may need to be rescheduled.  Do not eat food after midnight the night before surgery.  No gum chewing, lozengers or hard candies.  You may however, drink CLEAR liquids up to 2 hours before you are scheduled to arrive for your surgery. Do not drink anything within 2 hours of your scheduled arrival time.  Clear liquids include: - water  - apple juice without pulp - gatorade (not RED, PURPLE, OR BLUE) - black coffee or tea (Do NOT add milk or creamers to the coffee or tea) Do NOT drink anything that is not on this list.  In addition, your doctor has ordered for you to drink the provided  Ensure Pre-Surgery Clear Carbohydrate Drink  Drinking this carbohydrate drink up to two hours before surgery helps to reduce insulin resistance and improve patient outcomes. Please complete drinking 2 hours prior to scheduled arrival time.  TAKE THESE MEDICATIONS THE MORNING OF SURGERY WITH A SIP OF WATER:  Albuterol inhaler Buspirone Duloxetine Advair inhaler Omeprazole (Prilosec) - (take one the night before and one on the morning of surgery - helps to prevent nausea after surgery.)  Use inhalers on the day of surgery and bring to the hospital.  One week prior to surgery: starting January 6 Stop MELOXICAM and Anti-inflammatories (NSAIDS) such as Advil, Aleve, Ibuprofen, Motrin, Naproxen, Naprosyn and Aspirin based products such as Excedrin, Goodys Powder, BC Powder. Stop ANY OVER THE COUNTER supplements until after surgery. Stop vitamin D, folic acid, magnesium You may however, continue to  take Tylenol if needed for pain up until the day of surgery.  No Alcohol for 24 hours before or after surgery.  No Smoking including e-cigarettes for 24 hours prior to surgery.  No chewable tobacco products for at least 6 hours prior to surgery.  No nicotine patches on the day of surgery.  Do not use any "recreational" drugs for at least a week prior to your surgery.  Please be advised that the combination of cocaine and anesthesia may have negative outcomes, up to and including death. If you test positive for cocaine, your surgery will be cancelled.  On the morning of surgery brush your teeth with toothpaste and water, you may rinse your mouth with mouthwash if you wish. Do not swallow any toothpaste or mouthwash.  Use CHG Soap as directed on instruction sheet.  Do not wear jewelry, make-up, hairpins, clips or nail polish.  Do not wear lotions, powders, or perfumes.   Do not shave body from the neck down 48 hours prior to surgery just in case you cut yourself which could leave a site for infection.  Also, freshly shaved skin may become irritated if using the CHG soap.  Contact lenses, hearing aids and dentures may not be worn into surgery.  Do not bring valuables to the hospital. [facility] is not responsible for any missing/lost belongings or valuables.   Notify your doctor if there is any change in your medical condition (cold, fever, infection).  Wear comfortable clothing (specific to your surgery type) to the hospital.  After surgery, you can help prevent lung complications by  doing breathing exercises.  Take deep breaths and cough every 1-2 hours. Your doctor may order a device called an Incentive Spirometer to help you take deep breaths. When coughing or sneezing, hold a pillow firmly against your incision with both hands. This is called splinting. Doing this helps protect your incision. It also decreases belly discomfort.  If you are being discharged the day of surgery,  you will not be allowed to drive home. You will need a responsible adult (18 years or older) to drive you home and stay with you that night.   If you are taking public transportation, you will need to have a responsible adult (18 years or older) with you. Please confirm with your physician that it is acceptable to use public transportation.   Please call the Pre-admissions Testing Dept. at [phone] if you have any questions about these instructions.  Surgery Visitation Policy:  Patients undergoing a surgery or procedure may have one family member or support person with them as long as that person is not COVID-19 positive or experiencing its symptoms.  That person may remain in the waiting area during the procedure and may rotate out with other people.

## 2021-08-10 ENCOUNTER — Ambulatory Visit: Payer: Medicaid Other

## 2021-08-12 ENCOUNTER — Ambulatory Visit: Payer: Medicaid Other | Admitting: Anesthesiology

## 2021-08-12 ENCOUNTER — Ambulatory Visit
Admission: RE | Admit: 2021-08-12 | Discharge: 2021-08-12 | Disposition: A | Discharge status: Home / Self Care | Payer: Medicaid Other | Attending: Obstetrics and Gynecology | Admitting: Obstetrics and Gynecology

## 2021-08-12 ENCOUNTER — Encounter
Admission: RE | Disposition: A | Discharge status: Home / Self Care | Payer: Self-pay | Source: Home / Self Care | Attending: Obstetrics and Gynecology

## 2021-08-12 ENCOUNTER — Encounter: Payer: Self-pay | Admitting: Obstetrics and Gynecology

## 2021-08-12 ENCOUNTER — Other Ambulatory Visit: Payer: Self-pay

## 2021-08-12 DIAGNOSIS — N870 Mild cervical dysplasia: Secondary | ICD-10-CM | POA: Insufficient documentation

## 2021-08-12 DIAGNOSIS — F17210 Nicotine dependence, cigarettes, uncomplicated: Secondary | ICD-10-CM | POA: Diagnosis not present

## 2021-08-12 DIAGNOSIS — J45909 Unspecified asthma, uncomplicated: Secondary | ICD-10-CM | POA: Insufficient documentation

## 2021-08-12 DIAGNOSIS — Z01818 Encounter for other preprocedural examination: Secondary | ICD-10-CM

## 2021-08-12 DIAGNOSIS — Z791 Long term (current) use of non-steroidal anti-inflammatories (NSAID): Secondary | ICD-10-CM | POA: Diagnosis not present

## 2021-08-12 DIAGNOSIS — N941 Unspecified dyspareunia: Secondary | ICD-10-CM | POA: Insufficient documentation

## 2021-08-12 DIAGNOSIS — N920 Excessive and frequent menstruation with regular cycle: Secondary | ICD-10-CM | POA: Diagnosis present

## 2021-08-12 DIAGNOSIS — Z79899 Other long term (current) drug therapy: Secondary | ICD-10-CM | POA: Diagnosis not present

## 2021-08-12 DIAGNOSIS — Z7951 Long term (current) use of inhaled steroids: Secondary | ICD-10-CM | POA: Diagnosis not present

## 2021-08-12 HISTORY — PX: BILATERAL SALPINGECTOMY: SHX5743

## 2021-08-12 HISTORY — PX: VAGINAL HYSTERECTOMY: SHX2639

## 2021-08-12 LAB — ABO/RH: ABO/RH(D): O NEG

## 2021-08-12 LAB — POCT PREGNANCY, URINE: Preg Test, Ur: NEGATIVE

## 2021-08-12 SURGERY — HYSTERECTOMY, VAGINAL
Anesthesia: General | Site: Vagina

## 2021-08-12 MED ORDER — FENTANYL CITRATE (PF) 100 MCG/2ML IJ SOLN
INTRAMUSCULAR | Status: AC
  Administered 2021-08-12: 25 ug via INTRAVENOUS
  Filled 2021-08-12: qty 2

## 2021-08-12 MED ORDER — PROPOFOL 10 MG/ML IV BOLUS
INTRAVENOUS | Status: AC
  Filled 2021-08-12: qty 20

## 2021-08-12 MED ORDER — KETOROLAC TROMETHAMINE 30 MG/ML IJ SOLN
INTRAMUSCULAR | Status: DC | PRN
Start: 2021-08-12 — End: 2021-08-12
  Administered 2021-08-12: 30 mg via INTRAVENOUS

## 2021-08-12 MED ORDER — ROCURONIUM BROMIDE 100 MG/10ML IV SOLN
INTRAVENOUS | Status: DC | PRN
  Administered 2021-08-12: 10 mg via INTRAVENOUS
  Administered 2021-08-12: 50 mg via INTRAVENOUS

## 2021-08-12 MED ORDER — CHLORHEXIDINE GLUCONATE 0.12 % MT SOLN: 15.0000 mL | Freq: Once | OROMUCOSAL | Status: AC

## 2021-08-12 MED ORDER — DEXAMETHASONE SODIUM PHOSPHATE 10 MG/ML IJ SOLN
INTRAMUSCULAR | Status: DC | PRN
  Administered 2021-08-12: 10 mg via INTRAVENOUS

## 2021-08-12 MED ORDER — LIDOCAINE-EPINEPHRINE 1 %-1:100000 IJ SOLN
INTRAMUSCULAR | Status: DC | PRN
  Administered 2021-08-12: 7 mL

## 2021-08-12 MED ORDER — MIDAZOLAM HCL 2 MG/2ML IJ SOLN
INTRAMUSCULAR | Status: AC
  Filled 2021-08-12: qty 2

## 2021-08-12 MED ORDER — ORAL CARE MOUTH RINSE: 15.0000 mL | Freq: Once | OROMUCOSAL | Status: AC

## 2021-08-12 MED ORDER — CHLORHEXIDINE GLUCONATE 0.12 % MT SOLN
OROMUCOSAL | Status: AC
  Administered 2021-08-12: 15 mL via OROMUCOSAL
  Filled 2021-08-12: qty 15

## 2021-08-12 MED ORDER — CEFAZOLIN SODIUM-DEXTROSE 2-4 GM/100ML-% IV SOLN
INTRAVENOUS | Status: AC
  Filled 2021-08-12: qty 100

## 2021-08-12 MED ORDER — CEFAZOLIN SODIUM-DEXTROSE 2-4 GM/100ML-% IV SOLN
2.0000 g | Freq: Once | INTRAVENOUS | Status: AC
Start: 2021-08-12 — End: 2021-08-12
  Administered 2021-08-12: 2 g via INTRAVENOUS

## 2021-08-12 MED ORDER — OXYCODONE HCL 5 MG PO TABS: 5.0000 mg | ORAL_TABLET | ORAL | Status: DC | PRN

## 2021-08-12 MED ORDER — HYDROMORPHONE HCL 1 MG/ML IJ SOLN
INTRAMUSCULAR | Status: DC | PRN
  Administered 2021-08-12: .5 mg via INTRAVENOUS

## 2021-08-12 MED ORDER — LIDOCAINE-EPINEPHRINE 1 %-1:100000 IJ SOLN
INTRAMUSCULAR | Status: AC
  Filled 2021-08-12: qty 1

## 2021-08-12 MED ORDER — ONDANSETRON HCL 4 MG/2ML IJ SOLN
INTRAMUSCULAR | Status: DC | PRN
  Administered 2021-08-12: 4 mg via INTRAVENOUS

## 2021-08-12 MED ORDER — KETAMINE HCL 10 MG/ML IJ SOLN
INTRAMUSCULAR | Status: DC | PRN
Start: 2021-08-12 — End: 2021-08-12
  Administered 2021-08-12: 30 mg via INTRAVENOUS

## 2021-08-12 MED ORDER — PROPOFOL 500 MG/50ML IV EMUL
INTRAVENOUS | Status: DC | PRN
  Administered 2021-08-12: 30 ug/kg/min via INTRAVENOUS

## 2021-08-12 MED ORDER — LACTATED RINGERS IV SOLN: INTRAVENOUS | Status: DC

## 2021-08-12 MED ORDER — ACETAMINOPHEN 500 MG PO TABS: 1000.0000 mg | ORAL_TABLET | ORAL | Status: AC

## 2021-08-12 MED ORDER — ONDANSETRON 4 MG PO TBDP: 4.0000 mg | ORAL_TABLET | Freq: Four times a day (QID) | ORAL | Status: DC | PRN

## 2021-08-12 MED ORDER — ACETAMINOPHEN 500 MG PO TABS
ORAL_TABLET | ORAL | Status: AC
  Administered 2021-08-12: 1000 mg via ORAL
  Filled 2021-08-12: qty 2

## 2021-08-12 MED ORDER — OXYCODONE HCL 5 MG PO TABS
ORAL_TABLET | ORAL | Status: AC
  Administered 2021-08-12: 5 mg via ORAL
  Filled 2021-08-12: qty 1

## 2021-08-12 MED ORDER — ROCURONIUM BROMIDE 10 MG/ML (PF) SYRINGE
PREFILLED_SYRINGE | INTRAVENOUS | Status: AC
  Filled 2021-08-12: qty 10

## 2021-08-12 MED ORDER — LIDOCAINE HCL (PF) 2 % IJ SOLN
INTRAMUSCULAR | Status: AC
  Filled 2021-08-12: qty 5

## 2021-08-12 MED ORDER — DEXAMETHASONE SODIUM PHOSPHATE 10 MG/ML IJ SOLN
INTRAMUSCULAR | Status: AC
  Filled 2021-08-12: qty 1

## 2021-08-12 MED ORDER — GABAPENTIN 300 MG PO CAPS: 300.0000 mg | ORAL_CAPSULE | ORAL | Status: AC

## 2021-08-12 MED ORDER — ONDANSETRON HCL 4 MG/2ML IJ SOLN: 4.0000 mg | Freq: Once | INTRAMUSCULAR | Status: DC | PRN

## 2021-08-12 MED ORDER — FENTANYL CITRATE (PF) 100 MCG/2ML IJ SOLN
INTRAMUSCULAR | Status: DC | PRN
Start: 2021-08-12 — End: 2021-08-12
  Administered 2021-08-12: 50 ug via INTRAVENOUS

## 2021-08-12 MED ORDER — POVIDONE-IODINE 10 % EX SWAB: 2.0000 "application " | Freq: Once | CUTANEOUS | Status: DC

## 2021-08-12 MED ORDER — KETAMINE HCL 50 MG/5ML IJ SOSY
PREFILLED_SYRINGE | INTRAMUSCULAR | Status: AC
  Filled 2021-08-12: qty 5

## 2021-08-12 MED ORDER — HYDROMORPHONE HCL 1 MG/ML IJ SOLN
INTRAMUSCULAR | Status: AC
  Filled 2021-08-12: qty 1

## 2021-08-12 MED ORDER — FENTANYL CITRATE (PF) 100 MCG/2ML IJ SOLN
25.0000 ug | INTRAMUSCULAR | Status: DC | PRN
  Administered 2021-08-12 (×3): 25 ug via INTRAVENOUS

## 2021-08-12 MED ORDER — PROPOFOL 10 MG/ML IV BOLUS
INTRAVENOUS | Status: DC | PRN
  Administered 2021-08-12: 200 mg via INTRAVENOUS

## 2021-08-12 MED ORDER — ONDANSETRON HCL 4 MG/2ML IJ SOLN
INTRAMUSCULAR | Status: AC
  Filled 2021-08-12: qty 2

## 2021-08-12 MED ORDER — MIDAZOLAM HCL 2 MG/2ML IJ SOLN
INTRAMUSCULAR | Status: DC | PRN
  Administered 2021-08-12: 2 mg via INTRAVENOUS

## 2021-08-12 MED ORDER — GABAPENTIN 300 MG PO CAPS
ORAL_CAPSULE | ORAL | Status: AC
  Administered 2021-08-12: 300 mg via ORAL
  Filled 2021-08-12: qty 1

## 2021-08-12 MED ORDER — PHENYLEPHRINE HCL (PRESSORS) 10 MG/ML IV SOLN
INTRAVENOUS | Status: DC | PRN
  Administered 2021-08-12 (×6): 100 ug via INTRAVENOUS

## 2021-08-12 MED ORDER — SUGAMMADEX SODIUM 500 MG/5ML IV SOLN
INTRAVENOUS | Status: DC | PRN
Start: 2021-08-12 — End: 2021-08-12
  Administered 2021-08-12: 100 mg via INTRAVENOUS

## 2021-08-12 MED ORDER — LIDOCAINE HCL (CARDIAC) PF 100 MG/5ML IV SOSY
PREFILLED_SYRINGE | INTRAVENOUS | Status: DC | PRN
  Administered 2021-08-12: 50 mg via INTRAVENOUS

## 2021-08-12 MED ORDER — FENTANYL CITRATE (PF) 100 MCG/2ML IJ SOLN
INTRAMUSCULAR | Status: AC
  Filled 2021-08-12: qty 2

## 2021-08-12 SURGICAL SUPPLY — 40 items
BAG URINE DRAIN 2000ML AR STRL (UROLOGICAL SUPPLIES) ×3 IMPLANT
CATH FOLEY 2WAY  5CC 16FR (CATHETERS) ×1
CATH ROBINSON RED A/P 16FR (CATHETERS) ×3 IMPLANT
CATH URTH 16FR FL 2W BLN LF (CATHETERS) ×2 IMPLANT
DRAPE PERI LITHO V/GYN (MISCELLANEOUS) ×3 IMPLANT
DRAPE SURG 17X11 SM STRL (DRAPES) ×3 IMPLANT
DRAPE UNDER BUTTOCK W/FLU (DRAPES) ×3 IMPLANT
ELECT REM PT RETURN 9FT ADLT (ELECTROSURGICAL) ×3
ELECTRODE REM PT RTRN 9FT ADLT (ELECTROSURGICAL) ×2 IMPLANT
GAUZE 4X4 16PLY ~~LOC~~+RFID DBL (SPONGE) ×6 IMPLANT
GLOVE SURG SYN 8.0 (GLOVE) ×3 IMPLANT
GLOVE SURG SYN 8.0 PF PI (GLOVE) ×2 IMPLANT
GOWN STRL REUS W/ TWL LRG LVL3 (GOWN DISPOSABLE) ×6 IMPLANT
GOWN STRL REUS W/ TWL XL LVL3 (GOWN DISPOSABLE) ×2 IMPLANT
GOWN STRL REUS W/TWL LRG LVL3 (GOWN DISPOSABLE) ×3
GOWN STRL REUS W/TWL XL LVL3 (GOWN DISPOSABLE) ×1
KIT PINK PAD W/HEAD ARE REST (MISCELLANEOUS) ×3
KIT PINK PAD W/HEAD ARM REST (MISCELLANEOUS) ×2 IMPLANT
KIT TURNOVER CYSTO (KITS) ×3 IMPLANT
LABEL OR SOLS (LABEL) ×3 IMPLANT
MANIFOLD NEPTUNE II (INSTRUMENTS) ×3 IMPLANT
NEEDLE HYPO 22GX1.5 SAFETY (NEEDLE) ×3 IMPLANT
PACK BASIN MINOR ARMC (MISCELLANEOUS) ×3 IMPLANT
PAD OB MATERNITY 4.3X12.25 (PERSONAL CARE ITEMS) ×3 IMPLANT
PAD PREP 24X41 OB/GYN DISP (PERSONAL CARE ITEMS) ×3 IMPLANT
SCRUB EXIDINE 4% CHG 4OZ (MISCELLANEOUS) ×3 IMPLANT
SOL PREP PROV IODINE SCRUB 4OZ (MISCELLANEOUS) ×3 IMPLANT
SOL PREP PVP 2OZ (MISCELLANEOUS) ×3
SOLUTION PREP PVP 2OZ (MISCELLANEOUS) ×2 IMPLANT
SURGILUBE 2OZ TUBE FLIPTOP (MISCELLANEOUS) ×3 IMPLANT
SUT PDS 2-0 27IN (SUTURE) ×1 IMPLANT
SUT VIC AB 0 CT1 27 (SUTURE) ×3
SUT VIC AB 0 CT1 27XCR 8 STRN (SUTURE) ×4 IMPLANT
SUT VIC AB 0 CT1 36 (SUTURE) ×3 IMPLANT
SUT VIC AB 2-0 SH 27 (SUTURE) ×1
SUT VIC AB 2-0 SH 27XBRD (SUTURE) ×2 IMPLANT
SYR 10ML LL (SYRINGE) ×3 IMPLANT
SYR CONTROL 10ML LL (SYRINGE) ×3 IMPLANT
WATER STERILE IRR 1000ML POUR (IV SOLUTION) ×3 IMPLANT
WATER STERILE IRR 500ML POUR (IV SOLUTION) ×3 IMPLANT

## 2021-08-12 NOTE — Discharge Instructions (Addendum)
AMBULATORY SURGERY  DISCHARGE INSTRUCTIONS   The drugs that you were given will stay in your system until tomorrow so for the next 24 hours you should not:  Drive an automobile Make any legal decisions Drink any alcoholic beverage   You may resume regular meals tomorrow.  Today it is better to start with liquids and gradually work up to solid foods.  You may eat anything you prefer, but it is better to start with liquids, then soup and crackers, and gradually work up to solid foods.   Please notify your doctor immediately if you have any unusual bleeding, trouble breathing, redness and pain at the surgery site, drainage, fever, or pain not relieved by medication.    Your post-operative visit with Dr.                                       is: Date:                        Time:    Please call to schedule your post-operative visit.  Additional Instructions:     PAIN MEDICATION OXYCODONE 5MG  GIVEN AT [place] AT 1:11 PM.

## 2021-08-12 NOTE — Transfer of Care (Signed)
Immediate Anesthesia Transfer of Care Note  Patient: Sara Joseph  Procedure(s) Performed: HYSTERECTOMY VAGINAL (Vagina ) BILATERAL SALPINGECTOMY (Bilateral: Vagina )  Patient Location: PACU  Anesthesia Type:General  Level of Consciousness: sedated  Airway & Oxygen Therapy: Patient Spontanous Breathing and Patient connected to face mask  Post-op Assessment: Report given to RN and Post -op Vital signs reviewed and stable  Post vital signs: Reviewed and stable  Last Vitals:  Vitals Value Taken Time  BP 99/70 08/12/21 1230  Temp 36.1 C 08/12/21 1230  Pulse 63 08/12/21 1234  Resp 7 08/12/21 1234  SpO2 100 % 08/12/21 1234  Vitals shown include unvalidated device data.  Last Pain:  Vitals:   08/12/21 1230  TempSrc:   PainSc: Asleep         Complications: No notable events documented.

## 2021-08-12 NOTE — Progress Notes (Signed)
Ready for [facility] and bilateral salpingectomy  Labs reviewed . All questions answered  proceed

## 2021-08-12 NOTE — Brief Op Note (Signed)
08/12/2021  12:21 PM  PATIENT:  Sara Joseph  46 y.o. female  PRE-OPERATIVE DIAGNOSIS:  menorrhagia, dyspareunia, cervial dysplasia  POST-OPERATIVE DIAGNOSIS:  menorrhagia, dyspareunia, cervial dysplasia  PROCEDURE:  Procedure(s): HYSTERECTOMY VAGINAL (N/A) BILATERAL SALPINGECTOMY (Bilateral) TV[facility] and Bilateral salpingectomy  SURGEON:  Surgeon(s) and Role:    * Lannie Heaps, Ihor Austin, MD - Primary    * Conard Novak, MD - Assisting  PHYSICIAN ASSISTANT: ASSISTANTS: none CST  ANESTHESIA:   general  EBL:  50 mL IOF 1000cc UO 150cc  BLOOD ADMINISTERED:none  DRAINS: none   LOCAL MEDICATIONS USED:  LIDOCAINE with epi 8 cc  SPECIMEN:  Source of Specimen:  cervix , uterus and bilateral distal fallopian tubes   DISPOSITION OF SPECIMEN:  PATHOLOGY  COUNTS:  YES  TOURNIQUET:  * No tourniquets in log *  DICTATION: .Other Dictation: Dictation Number verbal  PLAN OF CARE: Discharge to home after PACU  PATIENT DISPOSITION:  PACU - hemodynamically stable.   Delay start of Pharmacological VTE agent (>24hrs) due to surgical blood loss or risk of bleeding: not applicable

## 2021-08-12 NOTE — Op Note (Signed)
NAME: Sara Joseph, Sara Joseph MEDICAL RECORD NO: [id number] ACCOUNT NO: [id number] DATE OF BIRTH: May 17, 1976 FACILITY: [facility] LOCATION: [facility] PHYSICIAN: Suzy Bouchard, MD  Operative Report   DATE OF PROCEDURE: 08/12/2021  PREOPERATIVE DIAGNOSES:   1.  Menorrhagia. 2.  Dyspareunia. 3.  Uterine descensus. 4.  Cervical dysplasia.  POSTOPERATIVE DIAGNOSES:   1.  Menorrhagia. 2.  Dyspareunia. 3.  Uterine descensus. 4.  Cervical dysplasia.  PROCEDURE:   1.  Total vaginal hysterectomy. 2.  Bilateral salpingectomy.  SURGEON:  Suzy Bouchard, MD  FIRST ASSISTANT:  Thomasene Mohair, MD  ANESTHESIA:  General endotracheal anesthesia.  INDICATIONS:   20.  A 46 year old gravida 3, para 3, patient with a several year history of menorrhagia, which has been worsening with a negative workup.  2.  Dyspareunia, feeling like something is blocking the vagina with sexual activities. 3.  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 4. On patient's exam, she has second-degree uterine descensus.  DESCRIPTION OF PROCEDURE:  After adequate general endotracheal anesthesia, the patient was placed in dorsal supine position with the legs in the candy cane stirrups.  The lower abdomen, perineum and vagina were prepped and draped in normal sterile  fashion.  The patient did receive 2 grams of IV Ancef prior to commencement of the case.  Timeout was performed.  Weighted speculum was placed in the posterior vaginal vault and the anterior cervix was elevated with a Deaver retractor.  Two thyroid  tenaculums were placed on the cervix and the cervix was circumferentially injected with 1% lidocaine with 1:100,000 epinephrine.  A direct posterior colpotomy incision was made.  Upon entry into the posterior cul-de-sac, a long-billed weighted speculum  was placed.  The uterosacral ligaments were bilaterally clamped, transected and suture ligated with 0 Vicryl suture and tagged for  later identification.  The anterior cervix was circumferentially incised with the Bovie.  Cardinal ligaments were  bilaterally clamped, transected and suture ligated with 0 Vicryl suture.  Entry into the anterior cul-de-sac was accomplished sharply.  The Deaver retractor was placed within to elevate the bladder anteriorly.  The uterine arteries were then bilaterally  clamped, transected and suture ligated with 0 Vicryl suture.  Two additional clamps and suturing on the broad ligament ensued.  The cornua were then bilaterally clamped, transected and doubly ligated with 0 Vicryl suture.  Each fallopian tube was  identified and the distal portion of the fallopian tube was clamped, removed and suture ligated with 0 Vicryl suture.  Ovaries appeared normal.  The peritoneum was closed with a pursestring 2-0 PDS suture.  Two additional figure-of-eight sutures were  required for the right sidewall where there was a small amount of oozing.  Good hemostasis resulted.  The vaginal epithelium was then closed with a running 0 Vicryl suture and the uterosacral ligaments were plicated centrally and the rest of the vaginal  vault was closed with the Vicryl suture.  Of note, the bladder was drained at the beginning of the case, yielding 100 mL of urine and the patient's bladder was recatheterized at the end of the case, yielding additional 50 mL. There were no complications.  ESTIMATED BLOOD LOSS:  50 mL  INTRAOPERATIVE FLUIDS:  1000 mL  URINE OUTPUT:  150 mL total.  The patient did receive 30 mg intravenous Toradol at the end of the case.  She was taken in good condition to the recovery room.     PAA D: 08/12/2021 12:58:38 pm T: 08/12/2021 11:41:00 pm  JOB: [id number]/ [id number]

## 2021-08-12 NOTE — Anesthesia Procedure Notes (Addendum)
Procedure Name: Intubation Date/Time: 08/12/2021 10:53 AM Performed by: Loletha Grayer, CRNA Pre-anesthesia Checklist: Patient identified, Patient being monitored, Timeout performed, Emergency Drugs available and Suction available Patient Re-evaluated:Patient Re-evaluated prior to induction Oxygen Delivery Method: Circle system utilized Preoxygenation: Pre-oxygenation with 100% oxygen Induction Type: IV induction Ventilation: Mask ventilation without difficulty Laryngoscope Size: Mac and 3 Grade View: Grade I Tube type: Oral Tube size: 7.0 mm Number of attempts: 1 Airway Equipment and Method: Stylet Placement Confirmation: ETT inserted through vocal cords under direct vision, positive ETCO2 and breath sounds checked- equal and bilateral Secured at: 21 cm Tube secured with: Tape Dental Injury: Teeth and Oropharynx as per pre-operative assessment

## 2021-08-12 NOTE — Anesthesia Preprocedure Evaluation (Signed)
Anesthesia Evaluation  Patient identified by MRN, date of birth, ID band Patient awake    Reviewed: Allergy & Precautions, H&P , NPO status , Patient's Chart, lab work & pertinent test results, reviewed documented beta blocker date and time   Airway Mallampati: II  TM Distance: >3 FB Neck ROM: full    Dental  (+) Teeth Intact   Pulmonary neg shortness of breath, asthma , Current Smoker and Patient abstained from smoking.,    Pulmonary exam normal        Cardiovascular Exercise Tolerance: Poor negative cardio ROS Normal cardiovascular exam Rhythm:regular Rate:Normal     Neuro/Psych  Headaches,  Neuromuscular disease negative psych ROS   GI/Hepatic Neg liver ROS, GERD  Medicated,  Endo/Other  negative endocrine ROS  Renal/GU negative Renal ROS  negative genitourinary   Musculoskeletal   Abdominal   Peds  Hematology negative hematology ROS (+)   Anesthesia Other Findings Past Medical History: No date: Asthma No date: GERD (gastroesophageal reflux disease) 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) No date: Migraine No date: Myofascial pain syndrome No date: Raynaud disease No date: Vitamin B12 deficiency No date: Vitamin D deficiency Past Surgical History: 2018: HAND TENDON SURGERY; Left     Comment:  thumb 10/15/2020: LUMBAR DISC SURGERY     Comment:  herniated disc 07/31/2005: TUBAL LIGATION No date: TYMPANOPLASTY; Right     Comment:  as a child BMI    Body Mass Index: 24.02 kg/m     Reproductive/Obstetrics negative OB ROS                             Anesthesia Physical Anesthesia Plan  ASA: 2  Anesthesia Plan: General ETT   Post-op Pain Management:    Induction:   PONV Risk Score and Plan:   Airway Management Planned:   Additional Equipment:   Intra-op Plan:   Post-operative Plan:   Informed Consent: I have reviewed the patients History and Physical, chart,  labs and discussed the procedure including the risks, benefits and alternatives for the proposed anesthesia with the patient or authorized representative who has indicated his/her understanding and acceptance.     Dental Advisory Given  Plan Discussed with: CRNA  Anesthesia Plan Comments:         Anesthesia Quick Evaluation

## 2021-08-14 NOTE — Anesthesia Postprocedure Evaluation (Signed)
Anesthesia Post Note  Patient: Sara Joseph  Procedure(s) Performed: HYSTERECTOMY VAGINAL (Vagina ) BILATERAL SALPINGECTOMY (Bilateral: Vagina )  Patient location during evaluation: PACU Anesthesia Type: General Level of consciousness: awake and alert Pain management: pain level controlled Vital Signs Assessment: post-procedure vital signs reviewed and stable Respiratory status: spontaneous breathing, nonlabored ventilation, respiratory function stable and patient connected to nasal cannula oxygen Cardiovascular status: blood pressure returned to baseline and stable Postop Assessment: no apparent nausea or vomiting Anesthetic complications: no   No notable events documented.   Last Vitals:  Vitals:   08/12/21 1400 08/12/21 1413  BP: (!) 136/121 123/82  Pulse: 88 93  Resp: 20 16  Temp: (!) 36.3 C (!) 36.1 C  SpO2: 100% 100%    Last Pain:  Vitals:   08/12/21 1413  TempSrc: Temporal  PainSc: [place] Kelli Robeck

## 2021-08-15 ENCOUNTER — Encounter: Payer: Self-pay | Admitting: Obstetrics and Gynecology

## 2021-08-16 LAB — SURGICAL PATHOLOGY

## 2021-10-04 ENCOUNTER — Encounter
Admission: RE | Disposition: A | Discharge status: Home / Self Care | Payer: Self-pay | Source: Home / Self Care | Attending: Gastroenterology

## 2021-10-04 ENCOUNTER — Ambulatory Visit
Admission: RE | Admit: 2021-10-04 | Discharge: 2021-10-04 | Disposition: A | Discharge status: Home / Self Care | Payer: Medicaid Other | Attending: Gastroenterology | Admitting: Gastroenterology

## 2021-10-04 ENCOUNTER — Ambulatory Visit: Payer: Medicaid Other | Admitting: Certified Registered"

## 2021-10-04 ENCOUNTER — Encounter: Payer: Self-pay | Admitting: *Deleted

## 2021-10-04 DIAGNOSIS — Z1211 Encounter for screening for malignant neoplasm of colon: Secondary | ICD-10-CM | POA: Insufficient documentation

## 2021-10-04 DIAGNOSIS — G43909 Migraine, unspecified, not intractable, without status migrainosus: Secondary | ICD-10-CM | POA: Diagnosis not present

## 2021-10-04 DIAGNOSIS — J45909 Unspecified asthma, uncomplicated: Secondary | ICD-10-CM | POA: Insufficient documentation

## 2021-10-04 DIAGNOSIS — F17210 Nicotine dependence, cigarettes, uncomplicated: Secondary | ICD-10-CM | POA: Diagnosis not present

## 2021-10-04 DIAGNOSIS — K219 Gastro-esophageal reflux disease without esophagitis: Secondary | ICD-10-CM | POA: Insufficient documentation

## 2021-10-04 DIAGNOSIS — K5730 Diverticulosis of large intestine without perforation or abscess without bleeding: Secondary | ICD-10-CM | POA: Diagnosis not present

## 2021-10-04 DIAGNOSIS — Z90710 Acquired absence of both cervix and uterus: Secondary | ICD-10-CM | POA: Diagnosis not present

## 2021-10-04 DIAGNOSIS — K640 First degree hemorrhoids: Secondary | ICD-10-CM | POA: Diagnosis not present

## 2021-10-04 DIAGNOSIS — R1013 Epigastric pain: Secondary | ICD-10-CM | POA: Insufficient documentation

## 2021-10-04 HISTORY — PX: COLONOSCOPY WITH PROPOFOL: SHX5780

## 2021-10-04 HISTORY — PX: ESOPHAGOGASTRODUODENOSCOPY (EGD) WITH PROPOFOL: SHX5813

## 2021-10-04 LAB — POCT PREGNANCY, URINE: Preg Test, Ur: NEGATIVE

## 2021-10-04 SURGERY — COLONOSCOPY WITH PROPOFOL
Anesthesia: General

## 2021-10-04 MED ORDER — LIDOCAINE HCL (CARDIAC) PF 100 MG/5ML IV SOSY
PREFILLED_SYRINGE | INTRAVENOUS | Status: DC | PRN
Start: 2021-10-04 — End: 2021-10-04
  Administered 2021-10-04: 100 mg via INTRAVENOUS

## 2021-10-04 MED ORDER — LIDOCAINE HCL (PF) 1 % IJ SOLN
INTRAMUSCULAR | Status: AC
  Filled 2021-10-04: qty 2

## 2021-10-04 MED ORDER — GLYCOPYRROLATE 0.2 MG/ML IJ SOLN
INTRAMUSCULAR | Status: DC | PRN
  Administered 2021-10-04: .2 mg via INTRAVENOUS

## 2021-10-04 MED ORDER — PROPOFOL 500 MG/50ML IV EMUL
INTRAVENOUS | Status: DC | PRN
  Administered 2021-10-04: 150 ug/kg/min via INTRAVENOUS

## 2021-10-04 MED ORDER — SODIUM CHLORIDE 0.9 % IV SOLN
INTRAVENOUS | Status: DC
  Administered 2021-10-04: 1000 mL via INTRAVENOUS

## 2021-10-04 MED ORDER — PHENYLEPHRINE HCL (PRESSORS) 10 MG/ML IV SOLN
INTRAVENOUS | Status: DC | PRN
  Administered 2021-10-04: 120 ug via INTRAVENOUS
  Administered 2021-10-04: 80 ug via INTRAVENOUS

## 2021-10-04 MED ORDER — PROPOFOL 10 MG/ML IV BOLUS
INTRAVENOUS | Status: DC | PRN
  Administered 2021-10-04: 120 mg via INTRAVENOUS
  Administered 2021-10-04: 50 mg via INTRAVENOUS
  Administered 2021-10-04: 130 mg via INTRAVENOUS

## 2021-10-04 NOTE — Transfer of Care (Signed)
Immediate Anesthesia Transfer of Care Note ? ?Patient: Sara Joseph ? ?Procedure(s) Performed: COLONOSCOPY WITH PROPOFOL ?ESOPHAGOGASTRODUODENOSCOPY (EGD) WITH PROPOFOL ? ?Patient Location: PACU ? ?Anesthesia Type:General ? ?Level of Consciousness: drowsy ? ?Airway & Oxygen Therapy: Patient Spontanous Breathing ? ?Post-op Assessment: Report given to RN ? ?Post vital signs: stable ? ?Last Vitals:  ?Vitals Value Taken Time  ?BP    ?Temp    ?Pulse    ?Resp    ?SpO2    ? ? ?Last Pain:  ?Vitals:  ? 10/04/21 1033  ?TempSrc: Temporal  ?PainSc: 0-No pain  ?   ? ?  ? ?Complications: No notable events documented. ?

## 2021-10-04 NOTE — Interval H&P Note (Signed)
History and Physical Interval Note: ? ?10/04/2021 ?10:47 AM ? ?Sara Joseph  has presented today for surgery, with the diagnosis of lower abdominla pain(R10.30),Diarrhea(R19.7),Rectal bleeding(K62.5), Dyspepsia(R10.13).  The various methods of treatment have been discussed with the patient and family. After consideration of risks, benefits and other options for treatment, the patient has consented to  Procedure(s): ?COLONOSCOPY WITH PROPOFOL (N/A) ?ESOPHAGOGASTRODUODENOSCOPY (EGD) WITH PROPOFOL (N/A) as a surgical intervention.  The patient's history has been reviewed, patient examined, no change in status, stable for surgery.  I have reviewed the patient's chart and labs.  Questions were answered to the patient's satisfaction.   ? ? ?[name] ? ?Ok to proceed with EGD/Colonoscopy ?

## 2021-10-04 NOTE — Op Note (Signed)
[facility] Patient Name: Sara Joseph Procedure Date: 10/04/2021 10:40 AM MRN: [id number] Account #: [id number] Date of Birth: [birth year] Admit Type: Outpatient Age: 46 Room: [facility] ENDO ROOM 1 Gender: Female Note Status: Finalized Instrument Name: Park Meo [id number] Procedure:             Colonoscopy Indications:           Screening for colorectal malignant neoplasm Providers:             Andrey Farmer MD, MD Referring MD:          Marlena Clipper Medicines:             Monitored Anesthesia Care Complications:         No immediate complications. Estimated blood loss:                         Minimal. Procedure:             Pre-Anesthesia Assessment:                        - Prior to the procedure, a History and Physical was                         performed, and patient medications and allergies were                         reviewed. The patient is competent. The risks and                         benefits of the procedure and the sedation options and                         risks were discussed with the patient. All questions                         were answered and informed consent was obtained.                         Patient identification and proposed procedure were                         verified by the physician, the nurse, the                         anesthesiologist, the anesthetist and the technician                         in the endoscopy suite. Mental Status Examination:                         alert and oriented. Airway Examination: normal                         oropharyngeal airway and neck mobility. Respiratory                         Examination: clear to auscultation. CV Examination:  normal. Prophylactic Antibiotics: The patient does not                         require prophylactic antibiotics. Prior                         Anticoagulants: The patient has taken no previous                          anticoagulant or antiplatelet agents. ASA Grade                         Assessment: II - A patient with mild systemic disease.                         After reviewing the risks and benefits, the patient                         was deemed in satisfactory condition to undergo the                         procedure. The anesthesia plan was to use monitored                         anesthesia care (MAC). Immediately prior to                         administration of medications, the patient was                         re-assessed for adequacy to receive sedatives. The                         heart rate, respiratory rate, oxygen saturations,                         blood pressure, adequacy of pulmonary ventilation, and                         response to care were monitored throughout the                         procedure. The physical status of the patient was                         re-assessed after the procedure.                        After obtaining informed consent, the colonoscope was                         passed under direct vision. Throughout the procedure,                         the patient's blood pressure, pulse, and oxygen                         saturations were monitored continuously. The  Colonoscope was introduced through the anus and                         advanced to the the terminal ileum. The colonoscopy                         was performed without difficulty. The patient                         tolerated the procedure well. The quality of the bowel                         preparation was good. Findings:      The perianal and digital rectal examinations were normal.      The terminal ileum appeared normal.      A single small-mouthed diverticulum was found in the sigmoid colon.      Internal hemorrhoids were found during retroflexion. The hemorrhoids       were Grade I (internal hemorrhoids that do not prolapse).      The exam was otherwise  without abnormality on direct and retroflexion       views. Impression:            - The examined portion of the ileum was normal.                        - Diverticulosis in the sigmoid colon.                        - Internal hemorrhoids.                        - The examination was otherwise normal on direct and                         retroflexion views.                        - No specimens collected. Recommendation:        - Discharge patient to home.                        - Resume previous diet.                        - Continue present medications.                        - Repeat colonoscopy in 10 years for screening                         purposes.                        - Return to referring physician as previously                         scheduled. Procedure Code(s):     --- Professional ---                        O9735, Colorectal cancer screening; colonoscopy on  individual not meeting criteria for high risk Diagnosis Code(s):     --- Professional ---                        Z12.11, Encounter for screening for malignant neoplasm                         of colon                        K64.0, First degree hemorrhoids                        K57.30, Diverticulosis of large intestine without                         perforation or abscess without bleeding CPT copyright 2019 American Medical Association. All rights reserved. The codes documented in this report are preliminary and upon coder review may  be revised to meet current compliance requirements. Andrey Farmer MD, MD 10/04/2021 11:54:43 AM Number of Addenda: 0 Note Initiated On: 10/04/2021 10:40 AM Scope Withdrawal Time: 0 hours 5 minutes 37 seconds  Total Procedure Duration: 0 hours 8 minutes 43 seconds  Estimated Blood Loss:  Estimated blood loss was minimal.      [facility]

## 2021-10-04 NOTE — Anesthesia Postprocedure Evaluation (Signed)
Anesthesia Post Note ? ?Patient: Sara Joseph ? ?Procedure(s) Performed: COLONOSCOPY WITH PROPOFOL ?ESOPHAGOGASTRODUODENOSCOPY (EGD) WITH PROPOFOL ? ?Patient location during evaluation: Endoscopy ?Anesthesia Type: General ?Level of consciousness: awake and alert ?Pain management: pain level controlled ?Vital Signs Assessment: post-procedure vital signs reviewed and stable ?Respiratory status: spontaneous breathing, nonlabored ventilation, respiratory function stable and patient connected to nasal cannula oxygen ?Cardiovascular status: blood pressure returned to baseline and stable ?Postop Assessment: no apparent nausea or vomiting ?Anesthetic complications: no ? ? ?No notable events documented. ? ? ?Last Vitals:  ?Vitals:  ? 10/04/21 1213 10/04/21 1223  ?BP: 106/70 98/82  ?Pulse: 87 83  ?Resp: 12 (!) 21  ?Temp:    ?SpO2: 99% 95%  ?  ?Last Pain:  ?Vitals:  ? 10/04/21 1223  ?TempSrc:   ?PainSc: 0-No pain  ? ? ?  ?  ?  ?  ?  ?  ? ?Lenard Simmer ? ? ? ? ?

## 2021-10-04 NOTE — Op Note (Signed)
[facility] ?Gastroenterology ?Patient Name: Sara Joseph ?Procedure Date: 10/04/2021 10:41 AM ?MRN: [id number] ?Account #: [id number] ?Date of Birth: 08-23-1975 ?Admit Type: Outpatient ?Age: 46 ?Room: [facility] ENDO ROOM 1 ?Gender: Female ?Note Status: Finalized ?Instrument Name: Upper Endoscope [id number] ?Procedure:             Upper GI endoscopy ?Indications:           Dyspepsia ?Providers:             Andrey Farmer MD, MD ?Referring MD:          [facility] [place] ?Medicines:             Monitored Anesthesia Care ?Complications:         No immediate complications. Estimated blood loss:  ?                       Minimal. ?Procedure:             Pre-Anesthesia Assessment: ?                       - Prior to the procedure, a History and Physical was  ?                       performed, and patient medications and allergies were  ?                       reviewed. The patient is competent. The risks and  ?                       benefits of the procedure and the sedation options and  ?                       risks were discussed with the patient. All questions  ?                       were answered and informed consent was obtained.  ?                       Patient identification and proposed procedure were  ?                       verified by the physician, the nurse, the  ?                       anesthesiologist, the anesthetist and the technician  ?                       in the endoscopy suite. Mental Status Examination:  ?                       alert and oriented. Airway Examination: normal  ?                       oropharyngeal airway and neck mobility. Respiratory  ?                       Examination: clear to auscultation. CV Examination:  ?  normal. Prophylactic Antibiotics: The patient does not  ?                       require prophylactic antibiotics. Prior  ?                       Anticoagulants: The patient has taken no previous  ?                       anticoagulant or  antiplatelet agents. ASA Grade  ?                       Assessment: II - A patient with mild systemic disease.  ?                       After reviewing the risks and benefits, the patient  ?                       was deemed in satisfactory condition to undergo the  ?                       procedure. The anesthesia plan was to use monitored  ?                       anesthesia care (MAC). Immediately prior to  ?                       administration of medications, the patient was  ?                       re-assessed for adequacy to receive sedatives. The  ?                       heart rate, respiratory rate, oxygen saturations,  ?                       blood pressure, adequacy of pulmonary ventilation, and  ?                       response to care were monitored throughout the  ?                       procedure. The physical status of the patient was  ?                       re-assessed after the procedure. ?                       After obtaining informed consent, the endoscope was  ?                       passed under direct vision. Throughout the procedure,  ?                       the patient's blood pressure, pulse, and oxygen  ?                       saturations were monitored continuously. The Endoscope  ?  was introduced through the mouth, and advanced to the  ?                       second part of duodenum. The upper GI endoscopy was  ?                       accomplished without difficulty. The patient tolerated  ?                       the procedure well. ?Findings: ?     The examined esophagus was normal. Biopsies were obtained from the  ?     proximal and distal esophagus with cold forceps for histology of  ?     suspected eosinophilic esophagitis. Estimated blood loss was minimal. ?     The entire examined stomach was normal. Biopsies were taken with a cold  ?     forceps for Helicobacter pylori testing. Estimated blood loss was  ?     minimal. ?     The examined duodenum was  normal. ?Impression:            - Normal esophagus. Biopsied. ?                       - Normal stomach. Biopsied. ?                       - Normal examined duodenum. ?Recommendation:        - Discharge patient to home. ?                       - Resume previous diet. ?                       - Continue present medications. ?                       - Await pathology results. ?                       - Perform a colonoscopy today. ?Procedure Code(s):     --- Professional --- ?                       [phone], Esophagogastroduodenoscopy, flexible,  ?                       transoral; with biopsy, single or multiple ?Diagnosis Code(s):     --- Professional --- ?                       R10.13, Epigastric pain ?CPT copyright 2019 American Medical Association. All rights reserved. ?The codes documented in this report are preliminary and upon coder review may  ?be revised to meet current compliance requirements. ?Andrey Farmer MD, MD ?10/04/2021 11:52:10 AM ?Number of Addenda: 0 ?Note Initiated On: 10/04/2021 10:41 AM ?Estimated Blood Loss:  Estimated blood loss was minimal. ?     [facility] ?

## 2021-10-04 NOTE — Anesthesia Preprocedure Evaluation (Signed)
Anesthesia Evaluation  ?Patient identified by MRN, date of birth, ID band ?Patient awake ? ? ? ?Reviewed: ?Allergy & Precautions, H&P , NPO status , Patient's Chart, lab work & pertinent test results, reviewed documented beta blocker date and time  ? ?Airway ?Mallampati: II ? ?TM Distance: >3 FB ?Neck ROM: full ? ? ? Dental ? ?(+) Teeth Intact, Dental Advidsory Given, Missing ?  ?Pulmonary ?neg shortness of breath, asthma , neg recent URI, Current Smoker and Patient abstained from smoking.,  ?  ?Pulmonary exam normal ? ? ? ? ? ? ? Cardiovascular ?Exercise Tolerance: Poor ?negative cardio ROS ?Normal cardiovascular exam ?Rhythm:regular Rate:Normal ? ? ?  ?Neuro/Psych ? Headaches, neg Seizures  Neuromuscular disease negative psych ROS  ? GI/Hepatic ?Neg liver ROS, GERD  Medicated,  ?Endo/Other  ?negative endocrine ROS ? Renal/GU ?negative Renal ROS  ?negative genitourinary ?  ?Musculoskeletal ? ? Abdominal ?  ?Peds ? 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    Comment:  thumb ?10/15/2020: LUMBAR DISC SURGERY ?    Comment:  herniated disc ?07/31/2005: TUBAL LIGATION ?No date: TYMPANOPLASTY; Right ?    Comment:  as a child ?BMI   ? Body Mass Index: 24.02 kg/m?  ?  ? Reproductive/Obstetrics ?negative OB ROS ? ?  ? ? ? ? ? ? ? ? ? ? ? ? ? ?  ?  ? ? ? ? ? ? ? ? ?Anesthesia Physical ? ?Anesthesia Plan ? ?ASA: 2 ? ?Anesthesia Plan: General ETT  ? ?Post-op Pain Management:   ? ?Induction: Intravenous ? ?PONV Risk Score and Plan: 2 and Propofol infusion and TIVA ? ?Airway Management Planned: Natural Airway and Nasal  Cannula ? ?Additional Equipment:  ? ?Intra-op Plan:  ? ?Post-operative Plan:  ? ?Informed Consent: I have reviewed the patients History and Physical, chart, labs and discussed the procedure including the risks, benefits and alternatives for the proposed anesthesia with the patient or authorized representative who has indicated his/her understanding and acceptance.  ? ? ? ?Dental Advisory Given ? ?Plan Discussed with: CRNA ? ?Anesthesia Plan Comments:   ? ? ? ? ? ? ?Anesthesia Quick Evaluation ? ?

## 2021-10-04 NOTE — H&P (Signed)
Outpatient short stay form Pre-procedure ?10/04/2021  ?Regis Bill, MD ? ?Primary Physician: [facility] ? ?Reason for visit:  Dyspepsia/Colon cancer screening ? ?History of present illness:   ? ?46 y/o lady with history of GERD, migraines, and asthma here for EGD for dyspeptic symptoms and colonoscopy for colon cancer screening. No blood thinners. No known family history of GI malignancies. Had recent hysterectomy in January.  ? ? ? ?Current Facility-Administered Medications:  ?  0.9 %  sodium chloride infusion, , Intravenous, Continuous, Shekita Boyden, Rossie Muskrat, MD ? ?Facility-Administered Medications Prior to Admission  ?Medication Dose Route Frequency Provider Last Rate Last Admin  ? albuterol (PROVENTIL) (2.5 MG/3ML) 0.083% nebulizer solution 2.5 mg  2.5 mg Nebulization Once Duanne Limerick, MD      ? ?Medications Prior to Admission  ?Medication Sig Dispense Refill Last Dose  ? busPIRone (BUSPAR) 7.5 MG tablet Take 7.5 mg by mouth 2 (two) times daily.   10/03/2021  ? cholecalciferol (VITAMIN D) 25 MCG (1000 UNIT) tablet Take 1,000 Units by mouth daily.   10/03/2021  ? cycloSPORINE (RESTASIS) 0.05 % ophthalmic emulsion Place 1 drop into both eyes 2 (two) times daily.   10/03/2021  ? DULoxetine HCl 40 MG CPEP Take 40 mg by mouth daily.   10/03/2021  ? fluticasone (FLONASE) 50 MCG/ACT nasal spray Place 2 sprays into both nostrils daily.   10/03/2021  ? hydroxychloroquine (PLAQUENIL) 200 MG tablet Take 200 mg by mouth 2 (two) times daily.   10/03/2021  ? methotrexate (RHEUMATREX) 2.5 MG tablet Take 12.5 mg by mouth once a week.   10/03/2021  ? topiramate (TOPAMAX) 50 MG tablet Take 50 mg by mouth 2 (two) times daily.   10/03/2021  ? albuterol (PROVENTIL HFA;VENTOLIN HFA) 108 (90 Base) MCG/ACT inhaler Inhale 2 puffs into the lungs every 6 (six) hours as needed for wheezing or shortness of breath. 1 Inhaler 11   ? ALPRAZolam (XANAX) 0.25 MG tablet Take 0.25 mg by mouth daily as needed for anxiety.     ? SUMAtriptan  (IMITREX) 100 MG tablet Take 100 mg by mouth every 2 (two) hours as needed for migraine.     ? ? ? ?Allergies  ?Allergen Reactions  ? Morphine Anaphylaxis and Nausea Only  ?  Other reaction(s): Chest Pain, Headache, Respiratory Distress  ? Nortriptyline Other (See Comments)  ?  Confusion  ? Tape Rash  ? Amitriptyline Other (See Comments)  ?  Confusion  ? ? ? ?Past Medical History:  ?Diagnosis Date  ? Asthma   ? GERD (gastroesophageal reflux disease)   ? Inflammatory polyarthropathy (HCC)   ? Migraine   ? Myofascial pain syndrome   ? Raynaud disease   ? Vitamin B12 deficiency   ? Vitamin D deficiency   ? ? ?Review of systems:  Otherwise negative.  ? ? ?Physical Exam ? ?Gen: Alert, oriented. Appears stated age.  ?HEENT: PERRLA. ?Lungs: No respiratory distress ?CV: RRR ?Abd: soft, benign, no masses ?Ext: No edema ? ? ? ?Planned procedures: Proceed with EGD/colonoscopy. The patient understands the nature of the planned procedure, indications, risks, alternatives and potential complications including but not limited to bleeding, infection, perforation, damage to internal organs and possible oversedation/side effects from anesthesia. The patient agrees and gives consent to proceed.  ?Please refer to procedure notes for findings, recommendations and patient disposition/instructions.  ? ? ? ?Regis Bill, MD ?[facility] ? ? ? ?  ? ?

## 2021-10-05 ENCOUNTER — Encounter: Payer: Self-pay | Admitting: Gastroenterology

## 2021-10-05 LAB — SURGICAL PATHOLOGY

## 2021-10-31 ENCOUNTER — Ambulatory Visit
Admission: RE | Admit: 2021-10-31 | Discharge: 2021-10-31 | Disposition: A | Discharge status: Home / Self Care | Payer: Medicaid Other | Source: Ambulatory Visit | Attending: Obstetrics and Gynecology | Admitting: Obstetrics and Gynecology

## 2021-10-31 DIAGNOSIS — Z1231 Encounter for screening mammogram for malignant neoplasm of breast: Secondary | ICD-10-CM | POA: Diagnosis present

## 2021-11-29 ENCOUNTER — Other Ambulatory Visit: Payer: Self-pay | Admitting: Student

## 2021-11-29 DIAGNOSIS — G43119 Migraine with aura, intractable, without status migrainosus: Secondary | ICD-10-CM

## 2021-11-29 DIAGNOSIS — R4189 Other symptoms and signs involving cognitive functions and awareness: Secondary | ICD-10-CM

## 2021-12-01 ENCOUNTER — Ambulatory Visit: Payer: Medicaid Other | Attending: Specialist

## 2021-12-01 DIAGNOSIS — G4733 Obstructive sleep apnea (adult) (pediatric): Secondary | ICD-10-CM | POA: Insufficient documentation

## 2021-12-02 ENCOUNTER — Ambulatory Visit: Payer: Medicaid Other | Attending: Specialist

## 2021-12-02 DIAGNOSIS — G4733 Obstructive sleep apnea (adult) (pediatric): Secondary | ICD-10-CM | POA: Diagnosis not present

## 2021-12-02 DIAGNOSIS — G47419 Narcolepsy without cataplexy: Secondary | ICD-10-CM | POA: Diagnosis present

## 2021-12-08 ENCOUNTER — Ambulatory Visit
Admission: RE | Admit: 2021-12-08 | Discharge: 2021-12-08 | Disposition: A | Discharge status: Home / Self Care | Payer: Medicaid Other | Source: Ambulatory Visit | Attending: Student | Admitting: Student

## 2021-12-08 DIAGNOSIS — G43119 Migraine with aura, intractable, without status migrainosus: Secondary | ICD-10-CM | POA: Diagnosis present

## 2021-12-08 DIAGNOSIS — R4189 Other symptoms and signs involving cognitive functions and awareness: Secondary | ICD-10-CM | POA: Insufficient documentation

## 2022-07-13 ENCOUNTER — Other Ambulatory Visit: Payer: Self-pay | Admitting: Student

## 2022-07-13 ENCOUNTER — Encounter: Payer: Self-pay | Admitting: Student

## 2022-07-13 DIAGNOSIS — R4189 Other symptoms and signs involving cognitive functions and awareness: Secondary | ICD-10-CM

## 2022-07-13 DIAGNOSIS — G43119 Migraine with aura, intractable, without status migrainosus: Secondary | ICD-10-CM

## 2022-07-19 ENCOUNTER — Ambulatory Visit
Admission: RE | Admit: 2022-07-19 | Discharge: 2022-07-19 | Disposition: A | Discharge status: Home / Self Care | Payer: Medicaid Other | Source: Ambulatory Visit | Attending: Student | Admitting: Student

## 2022-07-19 DIAGNOSIS — R4189 Other symptoms and signs involving cognitive functions and awareness: Secondary | ICD-10-CM | POA: Insufficient documentation

## 2022-07-19 DIAGNOSIS — G43119 Migraine with aura, intractable, without status migrainosus: Secondary | ICD-10-CM | POA: Diagnosis present

## 2023-01-16 ENCOUNTER — Other Ambulatory Visit: Payer: Self-pay | Admitting: Family Medicine

## 2023-01-16 DIAGNOSIS — M5412 Radiculopathy, cervical region: Secondary | ICD-10-CM

## 2023-01-26 ENCOUNTER — Ambulatory Visit
Admission: RE | Admit: 2023-01-26 | Discharge: 2023-01-26 | Disposition: A | Discharge status: Home / Self Care | Payer: Medicaid Other | Source: Ambulatory Visit | Attending: Family Medicine | Admitting: Family Medicine

## 2023-01-26 DIAGNOSIS — M5412 Radiculopathy, cervical region: Secondary | ICD-10-CM

## 2023-01-26 MED ORDER — IOPAMIDOL (ISOVUE-M 200) INJECTION 41%
1.0000 mL | Freq: Once | INTRAMUSCULAR | Status: AC
  Administered 2023-01-26: 1 mL via EPIDURAL

## 2023-01-26 MED ORDER — TRIAMCINOLONE ACETONIDE 40 MG/ML IJ SUSP (RADIOLOGY)
60.0000 mg | Freq: Once | INTRAMUSCULAR | Status: AC
  Administered 2023-01-26: 60 mg via EPIDURAL

## 2023-01-26 NOTE — Discharge Instructions (Signed)
Post Procedure Spinal Discharge Instruction Sheet  You may resume a regular diet and any medications that you routinely take (including pain medications).  No driving day of procedure.  Light activity throughout the rest of the day.  Do not do any strenuous work, exercise, bending or lifting.  The day following the procedure, you can resume normal physical activity but you should refrain from exercising or physical therapy for at least three days thereafter.   Common Side Effects:  Headaches- take your usual medications as directed by your physician.  Increase your fluid intake.  Caffeinated beverages may be helpful.  Lie flat in bed until your headache resolves.  Restlessness or inability to sleep- you may have trouble sleeping for the next few days.  Ask your referring physician if you need any medication for sleep.  Facial flushing or redness- should subside within a few days.  Increased pain- a temporary increase in pain a day or two following your procedure is not unusual.  Take your pain medication as prescribed by your referring physician.  Leg cramps  Please contact our office at [phone] for the following symptoms: Fever greater than 100 degrees. Headaches unresolved with medication after 2-3 days. Increased swelling, pain, or redness at injection site. 

## 2023-07-23 ENCOUNTER — Ambulatory Visit: Payer: Medicaid Other | Attending: Neurology

## 2023-07-23 DIAGNOSIS — R4189 Other symptoms and signs involving cognitive functions and awareness: Secondary | ICD-10-CM | POA: Diagnosis present

## 2023-07-23 NOTE — Therapy (Signed)
OUTPATIENT SPEECH LANGUAGE PATHOLOGY  EVALUATION   Patient Name: Sara Joseph MRN: [id number] DOB:1975-09-15, 47 y.o., female Today's Date: 07/23/2023  PCP: [facility] [place]  REFERRING PROVIDER: Cristopher Peru, MD   End of Session - 07/23/23 1452     Visit Number 1    Number of Visits 24    Date for SLP Re-Evaluation 10/15/23    SLP Start Time 1405    SLP Stop Time  1500    SLP Time Calculation (min) 55 min              Patient Active Problem List   Diagnosis Date Noted   Asthma 10/27/2015   Hydradenitis 09/23/2015   Headache, migraine 02/25/2014    ONSET DATE: 07/05/23 (referral date; pt reports cognitive decline for "years")  REFERRING DIAG: cognitive impairment  THERAPY DIAG:  Cognitive impairment  Rationale for Evaluation and Treatment Rehabilitation  SUBJECTIVE:   SUBJECTIVE STATEMENT: Pt alert, pleasant, and cooperative. 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  No acute abnormality." PMHx GERD, migraines, cognitive impairment narcolepsy,anxiety, depressive disorder  PAIN:  Are you having pain?  Endorsed a background headache; constant   FALLS: Has patient fallen in last 6 months?  Yes  LIVING ENVIRONMENT: Lives with: lives with their partner Lives in: House/apartment  PLOF:  Level of assistance: Independent with ADLs Employment: Other: does not work   PATIENT GOALS   improve wordfinding and thinking  OBJECTIVE:   COGNITIVE COMMUNICATION: Overall cognitive status: Impaired Areas of  impairment:  Attention: Impaired: Selective, Alternating, Divided Memory: Impaired: Immediate Short term Prospective Behavior: Restless Auditory comprehension: WFL Verbal expression: Impaired: paucity of speech   AUDITORY COMPREHENSION: Overall auditory comprehension: Appears intact    READING COMPREHENSION: appeared WFL  EXPRESSION: verbal  VERBAL EXPRESSION: Level of generative/spontaneous verbalization: sentence and conversation Automatic speech: name: intact and social response: intact  Repetition: Appears intact Naming: Responsive: WFL, Confrontation: WFL, and Divergent: reduced for # of "p" words in 60s Pragmatics: Impaired: restless Interfering components: attention   WRITTEN EXPRESSION: Dominant hand: right   Written expression: Appears intact  MOTOR SPEECH: Overall motor speech: Appears intact   ORAL MOTOR EXAMINATION: WFL  STANDARDIZED ASSESSMENTS: Addenbrooke's Cognitive Examination - ACE III The Addenbrooke's Cognitive Examination-III (ACE-III) is a brief cognitive test that assesses five cognitive domains. The total score is 100 with higher scores indicating better cognitive functioning. Cut off scores of 88 and 82 are recommended for suspicion of dementia (88 has sensitivity of 1.00 and specificity of 0.96, 82 has sensitivity of 0.93 and specificity of 1.00). American Version A  Attention 18/18  Memory 22/26  Fluency 12/14  Language 26/26  Visuospatial 26/16  TOTAL ACE- III Score 94/100     PATIENT REPORTED OUTCOME MEASURES (PROM):  The Neuro-QOLT Item Bank v2.0-Cognition Function-Short Form is an eight-item test designed to measure difficulties with cognitive functioning (e.g., memory, attention and decision making or in the application of such abilities to everyday tasks (e.g., planning, organizing, calculating, remembering and learning). Source: Duwaine Maxin, J-S, et al. (2012). Neuro-QOL: brief measures of health-related quality of life for  clinical research in neurology. Neurology, 78(23), [phone].  In the past 7 days...  I had to read something several times to understand it. 1- Very Often (several times a day)  My thinking was slow. 1- Very Often (several times a day)  I had to work really hard to pay attention or I would make a mistake. 1- Very Often (several times a day)  I had trouble concentrating. 1- Very Often (several times a day)   How much DIFFICULTY do you currently have...  Reading and following complex instructions (e.g., directions for a new medication)? 2- A lot  Planning for and keeping appointments that are not part of your weekly routine (e.g., a therapy or doctor appointment, or a social gathering with friends and family)? 3- Somewhat  Managing your time to do most of your daily activities? 1- Cannot do  Learning new tasks or instructions? 2- A lot   T-SCORE: 12; >3 SD below mean of 50     TODAY'S TREATMENT:  Pt educated re: role of SLP, results of assessment, domains of cognition, possible factors that may be affecting cognitive-linguistic ability (e.g. comorbidities, migraines, sleep disturbances, vitamin deficiencies, mental health), rationale for ST, and SLP POC. Pt verbalized frustration with CLOF. Supportive counseling provided accordingly.   PATIENT EDUCATION: Education details: as above Person educated: Patient Education method: Explanation Education comprehension: verbalized understanding and needs further education  HOME EXERCISE PROGRAM:        TBD    GOALS:  Goals reviewed with patient? Yes  SHORT TERM GOALS: Target date: 10 sessions  With Min A, patient will use strategies to maintain attention to linguistic activities of interest / iADLs. Baseline: Goal status: INITIAL   2.   With Min A, pt will verbalize and demonstrate how to implement x3 memory and attention strategies to aid daily functioning over 2 sessions.  Baseline:  Goal status: INITIAL  3.  With Min A,  patient will complete a semantic feature analysis with at least 3 relevant features for 4/5 target words to improve word-finding skills.   Baseline:  Goal status: INITIAL  4.  Pt will verbalize at least x3 external factors that may affect cognitive-linguistic functioning. Baseline:  Goal status: INITIAL   LONG TERM GOALS: Target date: 12 weeks  Patient will demonstrate knowledge of appropriate activities to support cognitive and  language function outside of ST. Baseline:  Goal status: INITIAL  2.  Pt will utilize compensatory strategies for anomia with modified independence to repair communication breakdowns. Baseline:  Goal status: INITIAL    ASSESSMENT:  CLINICAL IMPRESSION:  Patient is a 47 y.o. female who was seen today for cognitive-communication evaluation in setting of cognitive impairment. Per Dr. Sherryll Burger, pt demonstrating "short-term memory difficulty, bradyphrenia, transposition of words." Assessment completed via Addenbrooke's Cognitive Examination (ACE III) and Neuro-QOL Cognitive Functioning - Short Form. Pt's self reported deficits and subjective reports were more impaired than results on formal assessment. Pt presents with cognitive-linguistic deficits affecting attention, memory, problem solving/executive functioning, and verbal expression. Speech is largely fluent with delayed verbal responses and paucity of speech, at times. No overt wordfinding difficulty observed conversationally, but reduced generative naming noted. Suspect pt's attentional deficits affecting all domains of cognition. Recommend course of ST targeting above mentioned deficits for implementation of compensations and education re: cognitive-linguistic functioning.  OBJECTIVE IMPAIRMENTS include attention, memory, executive functioning, and expressive language. These impairments are limiting patient from managing appointments, household responsibilities, ADLs/IADLs, and effectively communicating at home and  in community. Factors affecting potential to achieve goals and functional outcome are  co-morbidities.. Patient will benefit from skilled SLP services to address above impairments and improve overall function.  REHAB POTENTIAL: Good  PLAN: SLP FREQUENCY: 1-2x/week  SLP DURATION: 12 weeks  PLANNED INTERVENTIONS: Environmental controls, Cueing hierachy, Cognitive reorganization, Internal/external aids, and Functional tasks   Clyde Canterbury, M.S., CCC-SLP Speech-Language Pathologist [facility] [place] [facility] [phone] (ASCOM)  Clyde Canterbury, M.S., CCC-SLP Speech-Language Pathologist [facility] [phone] Arnette Felts)  [facility] at [facility] [address] [place], [place], [address] Phone: [phone]   Fax:  [phone]

## 2023-07-31 ENCOUNTER — Ambulatory Visit: Payer: Medicaid Other

## 2023-08-07 ENCOUNTER — Ambulatory Visit: Payer: Medicaid Other | Attending: Neurology

## 2023-08-07 DIAGNOSIS — R4189 Other symptoms and signs involving cognitive functions and awareness: Secondary | ICD-10-CM | POA: Insufficient documentation

## 2023-08-07 NOTE — Therapy (Addendum)
 OUTPATIENT SPEECH LANGUAGE PATHOLOGY  TREATMENT   Patient Name: Sara Joseph MRN: [id number] DOB:1976/03/09, 48 y.o., female Today's Date: 08/07/2023  PCP: [facility]  REFERRING PROVIDER: Jannett Fairly, MD   End of Session - 08/07/23 1537     Visit Number 2    Number of Visits 24    Date for SLP Re-Evaluation 10/15/23    SLP Start Time 1145    SLP Stop Time  1235    SLP Time Calculation (min) 50 min    Activity Tolerance Patient tolerated treatment well              Patient Active Problem List   Diagnosis Date Noted   Asthma 10/27/2015   Hydradenitis 09/23/2015   Headache, migraine 02/25/2014    ONSET DATE: 07/05/23 (referral date; pt reports cognitive decline for years)  REFERRING DIAG: cognitive impairment  THERAPY DIAG:  Cognitive impairment  Rationale for Evaluation and Treatment Rehabilitation  SUBJECTIVE:   SUBJECTIVE STATEMENT: Pt alert, pleasant, and cooperative. Endorsed feeling tired all of the time and stated she has had difficulty securing her migraine medication.  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  No acute abnormality. PMHx GERD, migraines, cognitive impairment narcolepsy,anxiety, depressive disorder  PAIN:  Are you having pain?  Endorsed a background headache; constant   FALLS: Has patient fallen in last 6 months?  Yes  LIVING ENVIRONMENT: Lives with: lives with their partner Lives in: House/apartment  PLOF:  Level of assistance: Independent with ADLs Employment: Other: does not work   PATIENT GOALS   improve wordfinding and thinking  OBJECTIVE:    TODAY'S TREATMENT:  Pt educated  re: types of attention, strategies to improve attention, possible factors that may be affecting cognitive-linguistic ability (e.g. comorbidities, migraines, sleep disturbances, vitamin deficiencies, mental health). Marked improvement in wordfinding today. Will continue to monitor. Pt verbalized frustration with CLOF. Supportive counseling provided accordingly.   PATIENT EDUCATION: Education details: as above Person educated: Patient Education method: Explanation; Handout Education comprehension: verbalized understanding and needs further education  HOME EXERCISE PROGRAM:        Start establishing a daily routine    GOALS:  Goals reviewed with patient? Yes  SHORT TERM GOALS: Target date: 10 sessions  With Min A, patient will use strategies to maintain attention to linguistic activities of interest / iADLs. Baseline: Goal status: INITIAL   2.   With Min A, pt will verbalize and demonstrate how to implement x3 memory and attention strategies to aid daily functioning over 2 sessions.  Baseline:  Goal status: INITIAL  3.  With Min A, patient will complete a semantic feature analysis with at least 3 relevant features for 4/5 target words to improve word-finding skills.   Baseline:  Goal status: INITIAL  4.  Pt will verbalize at least x3 external factors that may affect cognitive-linguistic functioning. Baseline:  Goal status: INITIAL   LONG TERM GOALS: Target date: 12 weeks  Patient will demonstrate knowledge of appropriate activities to support cognitive and  language function outside of ST. Baseline:  Goal status: INITIAL  2.  Pt will utilize compensatory strategies for anomia with modified independence to repair communication breakdowns. Baseline:  Goal status: INITIAL    ASSESSMENT:  CLINICAL IMPRESSION:  Patient is a 48 y.o. female who was seen today for cognitive-communication evaluation in setting of cognitive impairment. Per Dr. Maree, pt demonstrating  short-term memory difficulty, bradyphrenia, transposition of words. Pt presents with cognitive-linguistic deficits affecting attention, memory, problem solving/executive functioning, and verbal expression. Speech is largely fluent with delayed verbal responses and paucity of speech, at times. No overt wordfinding difficulty observed conversationally, but reduced generative naming noted on assessment. Suspect pt's attentional deficits affecting all domains of cognition. See details of tx session above. Recommend course of ST targeting above mentioned deficits for implementation of compensations and education re: cognitive-linguistic functioning.  OBJECTIVE IMPAIRMENTS include attention, memory, executive functioning, and expressive language. These impairments are limiting patient from managing appointments, household responsibilities, ADLs/IADLs, and effectively communicating at home and in community. Factors affecting potential to achieve goals and functional outcome are co-morbidities.. Patient will benefit from skilled SLP services to address above impairments and improve overall function.  REHAB POTENTIAL: Good  PLAN: SLP FREQUENCY: 1-2x/week  SLP DURATION: 12 weeks  PLANNED INTERVENTIONS: Environmental controls, Cueing hierachy, Cognitive reorganization, Internal/external aids, and Functional tasks    Delon Bangs, M.S., CCC-SLP Speech-Language Pathologist [place] [facility] [phone] [place])  [facility] Outpatient Rehabilitation at [facility] [address] [place], [place], [address] Phone: [phone]   Fax:  [phone]

## 2023-08-09 ENCOUNTER — Ambulatory Visit: Payer: Medicaid Other

## 2023-08-14 ENCOUNTER — Other Ambulatory Visit: Payer: Self-pay | Admitting: Medical Genetics

## 2023-08-14 ENCOUNTER — Ambulatory Visit: Payer: Medicaid Other

## 2023-08-14 DIAGNOSIS — R4189 Other symptoms and signs involving cognitive functions and awareness: Secondary | ICD-10-CM | POA: Diagnosis not present

## 2023-08-14 NOTE — Therapy (Signed)
 OUTPATIENT SPEECH LANGUAGE PATHOLOGY  TREATMENT   Patient Name: Sara Joseph MRN: [id number] DOB:January 07, 1976, 48 y.o., female Today's Date: 08/14/2023  PCP: [facility]  REFERRING PROVIDER: Jannett Fairly, MD   End of Session - 08/14/23 1236     Visit Number 3    Number of Visits 24    Date for SLP Re-Evaluation 10/15/23    SLP Start Time 1140    SLP Stop Time  1230    SLP Time Calculation (min) 50 min    Activity Tolerance Patient tolerated treatment well              Patient Active Problem List   Diagnosis Date Noted   Asthma 10/27/2015   Hydradenitis 09/23/2015   Headache, migraine 02/25/2014    ONSET DATE: 07/05/23 (referral date; pt reports cognitive decline for years)  REFERRING DIAG: cognitive impairment  THERAPY DIAG:  Cognitive impairment  Rationale for Evaluation and Treatment Rehabilitation  SUBJECTIVE:   SUBJECTIVE STATEMENT: Pt alert, pleasant, and cooperative. Endorsed feeling tired all of the time and stated she has had difficulty securing her migraine medication.  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  No acute abnormality. PMHx GERD, migraines, cognitive impairment narcolepsy,anxiety, depressive disorder  PAIN:  Are you having pain?  Endorsed a background headache; constant   FALLS: Has patient fallen in last 6 months?  Yes  LIVING ENVIRONMENT: Lives with: lives with their partner Lives in: House/apartment  PLOF:  Level of assistance: Independent with ADLs Employment: Other: does not work   PATIENT GOALS   improve wordfinding and thinking  OBJECTIVE:    TODAY'S TREATMENT:  Reviewed  re: types of strategies to improve attention, possible factors that may be affecting cognitive-linguistic ability (e.g. comorbidities, migraines, sleep disturbances, vitamin deficiencies, mental health). Marked improvement in wordfinding today. Introduced engineer, agricultural for improved wordfinding. Pt demonstrated with rare cueing after SLP demo.    PATIENT EDUCATION: Education details: as above Person educated: Patient Education method: Explanation; Handout Education comprehension: verbalized understanding and needs further education  HOME EXERCISE PROGRAM:        Start establishing a daily routine    GOALS:  Goals reviewed with patient? Yes  SHORT TERM GOALS: Target date: 10 sessions  With Min A, patient will use strategies to maintain attention to linguistic activities of interest / iADLs. Baseline: Goal status: INITIAL   2.   With Min A, pt will verbalize and demonstrate how to implement x3 memory and attention strategies to aid daily functioning over 2 sessions.  Baseline:  Goal status: INITIAL  3.  With Min A, patient will complete a semantic feature analysis with at least 3 relevant features for 4/5 target words to improve word-finding skills.   Baseline:  Goal status: INITIAL  4.  Pt will verbalize at least x3 external factors that may affect cognitive-linguistic functioning. Baseline:  Goal status: INITIAL   LONG TERM GOALS: Target date: 12 weeks  Patient will demonstrate knowledge of appropriate activities to support cognitive and  language function outside of ST. Baseline:  Goal status: INITIAL  2.  Pt will utilize compensatory strategies for anomia with modified independence to repair communication breakdowns. Baseline:  Goal status: INITIAL    ASSESSMENT:  CLINICAL  IMPRESSION:  Patient is a 48 y.o. female who was seen today for cognitive-communication treatment in setting of cognitive impairment. Per Dr. Maree, pt demonstrating short-term  memory difficulty, bradyphrenia, transposition of words. Pt presents with cognitive-linguistic deficits affecting attention, memory, problem solving/executive functioning, and verbal expression. Speech is largely fluent with delayed verbal responses and paucity of speech, at times. No overt wordfinding difficulty observed conversationally, but reduced generative naming noted on assessment. Suspect pt's attentional deficits affecting all domains of cognition. See details of tx session above. Recommend course of ST targeting above mentioned deficits for implementation of compensations and education re: cognitive-linguistic functioning.  OBJECTIVE IMPAIRMENTS include attention, memory, executive functioning, and expressive language. These impairments are limiting patient from managing appointments, household responsibilities, ADLs/IADLs, and effectively communicating at home and in community. Factors affecting potential to achieve goals and functional outcome are co-morbidities.. Patient will benefit from skilled SLP services to address above impairments and improve overall function.  REHAB POTENTIAL: Good  PLAN: SLP FREQUENCY: 1-2x/week  SLP DURATION: 12 weeks  PLANNED INTERVENTIONS: Environmental controls, Cueing hierachy, Cognitive reorganization, Internal/external aids, and Functional tasks    Delon Bangs, M.S., CCC-SLP Speech-Language Pathologist [place] [facility] [phone] [place])  [facility] Outpatient Rehabilitation at [facility] [address] [place], [place], [address] Phone: [phone]   Fax:  [phone]

## 2023-08-21 ENCOUNTER — Ambulatory Visit: Payer: Medicaid Other

## 2023-08-28 ENCOUNTER — Other Ambulatory Visit
Admission: RE | Admit: 2023-08-28 | Discharge: 2023-08-28 | Disposition: A | Discharge status: Home / Self Care | Payer: Self-pay | Source: Ambulatory Visit | Attending: Medical Genetics | Admitting: Medical Genetics

## 2023-08-28 ENCOUNTER — Ambulatory Visit: Payer: Medicaid Other

## 2023-08-28 DIAGNOSIS — R4189 Other symptoms and signs involving cognitive functions and awareness: Secondary | ICD-10-CM | POA: Diagnosis not present

## 2023-08-28 NOTE — Therapy (Signed)
OUTPATIENT SPEECH LANGUAGE PATHOLOGY  TREATMENT   Patient Name: Sara Joseph MRN: [id number] DOB:Jan 04, 1976, 48 y.o., female Today's Date: 08/28/2023  PCP: [facility]  REFERRING PROVIDER: Cristopher Peru, MD   End of Session - 08/28/23 1231     Visit Number 4    Number of Visits 24    Date for SLP Re-Evaluation 10/15/23    SLP Start Time 1145    SLP Stop Time  1230    SLP Time Calculation (min) 45 min    Activity Tolerance Patient tolerated treatment well              Patient Active Problem List   Diagnosis Date Noted   Asthma 10/27/2015   Hydradenitis 09/23/2015   Headache, migraine 02/25/2014    ONSET DATE: 07/05/23 (referral date; pt reports cognitive decline for "years")  REFERRING DIAG: cognitive impairment  THERAPY DIAG:  Cognitive impairment  Rationale for Evaluation and Treatment Rehabilitation  SUBJECTIVE:   SUBJECTIVE STATEMENT: Pt alert, pleasant, and cooperative. Endorsed feeling "tired all of the time" and stated she has had difficulty securing her migraine medication.  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  No acute abnormality." PMHx GERD, migraines, cognitive impairment narcolepsy,anxiety, depressive disorder  PAIN:  Are you having pain?  Endorsed a background headache; constant   FALLS: Has patient fallen in last 6 months?  Yes  LIVING ENVIRONMENT: Lives with: lives with their partner Lives in: House/apartment  PLOF:  Level of assistance: Independent with ADLs Employment: Other: does not work   PATIENT GOALS   improve wordfinding and thinking  OBJECTIVE:    TODAY'S TREATMENT:  Pt endorsed  difficulty sleeping, persistent fatigue, stress, and depressed mood. Supportive counseling provided as well as educated re: role of sleep, mood, and pyschosocial wellbeing on cognitive-linguistic ability. Pt encouraged to contact PCP for above mentioned issues. Marked improvement in wordfinding today. Reviewed semantic features analysis for improved wordfinding. Pt able to generate synonyms for adjectives with extra time. Pt endorsed difficulty with attention and executive functioning. Will plan to target in upcoming sessions.    PATIENT EDUCATION: Education details: as above Person educated: Patient Education method: Explanation; Handout Education comprehension: verbalized understanding and needs further education  HOME EXERCISE PROGRAM:        Start establishing a daily routine    GOALS:  Goals reviewed with patient? Yes  SHORT TERM GOALS: Target date: 10 sessions  With Min A, patient will use strategies to maintain attention to linguistic activities of interest / iADLs. Baseline: Goal status: INITIAL   2.   With Min A, pt will verbalize and demonstrate how to implement x3 memory and attention strategies to aid daily functioning over 2 sessions.  Baseline:  Goal status: INITIAL  3.  With Min A, patient will complete a semantic feature analysis with at least 3 relevant features for 4/5 target words to improve word-finding skills.   Baseline:  Goal status: INITIAL  4.  Pt will verbalize at least x3 external factors that may affect cognitive-linguistic functioning. Baseline:  Goal status: INITIAL   LONG TERM GOALS: Target date: 12 weeks  Patient will demonstrate knowledge of appropriate activities to support cognitive and  language function outside of ST. Baseline:  Goal status:  INITIAL  2.  Pt will utilize compensatory strategies for anomia with modified independence to repair communication breakdowns. Baseline:  Goal status: INITIAL    ASSESSMENT:  CLINICAL  IMPRESSION:  Patient is a 48 y.o. female who was seen today for cognitive-communication treatment in setting of cognitive impairment. Per Dr. Sherryll Burger, pt demonstrating "short-term memory difficulty, bradyphrenia, transposition of words." Pt presents with cognitive-linguistic deficits affecting attention, memory, problem solving/executive functioning, and verbal expression. Speech is largely fluent with delayed verbal responses and paucity of speech, at times. No overt wordfinding difficulty observed conversationally, but reduced generative naming noted on assessment. Suspect pt's attentional deficits affecting all domains of cognition. See details of tx session above. Recommend course of ST targeting above mentioned deficits for implementation of compensations and education re: cognitive-linguistic functioning.  OBJECTIVE IMPAIRMENTS include attention, memory, executive functioning, and expressive language. These impairments are limiting patient from managing appointments, household responsibilities, ADLs/IADLs, and effectively communicating at home and in community. Factors affecting potential to achieve goals and functional outcome are co-morbidities.. Patient will benefit from skilled SLP services to address above impairments and improve overall function.  REHAB POTENTIAL: Good  PLAN: SLP FREQUENCY: 1-2x/week  SLP DURATION: 12 weeks  PLANNED INTERVENTIONS: Environmental controls, Cueing hierachy, Cognitive reorganization, Internal/external aids, and Functional tasks    Clyde Canterbury, M.S., CCC-SLP Speech-Language Pathologist [place] [facility] [phone] Arnette Felts)   [facility] Outpatient Rehabilitation at [facility] [address] [place], [place], [address] Phone: [phone]   Fax:  [phone]

## 2023-09-04 ENCOUNTER — Ambulatory Visit: Payer: Medicaid Other

## 2023-09-09 LAB — GENECONNECT MOLECULAR SCREEN: Genetic Analysis Overall Interpretation: NEGATIVE

## 2023-09-11 ENCOUNTER — Ambulatory Visit: Payer: Medicaid Other

## 2023-09-18 ENCOUNTER — Ambulatory Visit: Payer: Medicaid Other

## 2023-11-23 ENCOUNTER — Other Ambulatory Visit: Payer: Self-pay | Admitting: Internal Medicine

## 2023-11-23 DIAGNOSIS — R0789 Other chest pain: Secondary | ICD-10-CM
